# Patient Record
Sex: Male | Born: 1970 | Race: White | Hispanic: No | Marital: Single | Smoking: Current every day smoker
Health system: Southern US, Community
[De-identification: ages and names within clinical notes are randomized; demographics above are authoritative.]

## PROBLEM LIST (undated history)

## (undated) DIAGNOSIS — J449 Chronic obstructive pulmonary disease, unspecified: Secondary | ICD-10-CM

## (undated) DIAGNOSIS — K219 Gastro-esophageal reflux disease without esophagitis: Secondary | ICD-10-CM

---

## 2009-04-26 ENCOUNTER — Emergency Department: Payer: Self-pay | Admitting: Internal Medicine

## 2009-12-13 ENCOUNTER — Emergency Department: Payer: Self-pay | Admitting: Emergency Medicine

## 2010-05-23 ENCOUNTER — Ambulatory Visit: Payer: Self-pay | Admitting: Cardiovascular Disease

## 2010-05-23 ENCOUNTER — Inpatient Hospital Stay: Payer: Self-pay | Admitting: Internal Medicine

## 2011-03-21 ENCOUNTER — Emergency Department: Payer: Self-pay | Admitting: Unknown Physician Specialty

## 2013-06-13 ENCOUNTER — Ambulatory Visit: Payer: Self-pay | Admitting: Gastroenterology

## 2013-07-04 ENCOUNTER — Ambulatory Visit: Payer: Self-pay | Admitting: Family Medicine

## 2014-05-15 ENCOUNTER — Emergency Department: Payer: Self-pay | Admitting: Emergency Medicine

## 2016-11-20 ENCOUNTER — Emergency Department: Payer: Self-pay

## 2016-11-20 ENCOUNTER — Encounter: Payer: Self-pay | Admitting: *Deleted

## 2016-11-20 ENCOUNTER — Emergency Department
Admission: EM | Admit: 2016-11-20 | Discharge: 2016-11-20 | Disposition: A | Discharge status: Home / Self Care | Payer: Self-pay | Attending: Emergency Medicine | Admitting: Emergency Medicine

## 2016-11-20 DIAGNOSIS — F17200 Nicotine dependence, unspecified, uncomplicated: Secondary | ICD-10-CM | POA: Insufficient documentation

## 2016-11-20 DIAGNOSIS — J449 Chronic obstructive pulmonary disease, unspecified: Secondary | ICD-10-CM | POA: Insufficient documentation

## 2016-11-20 DIAGNOSIS — J40 Bronchitis, not specified as acute or chronic: Secondary | ICD-10-CM | POA: Insufficient documentation

## 2016-11-20 HISTORY — DX: Chronic obstructive pulmonary disease, unspecified: J44.9

## 2016-11-20 HISTORY — DX: Gastro-esophageal reflux disease without esophagitis: K21.9

## 2016-11-20 LAB — BASIC METABOLIC PANEL WITH GFR
Anion gap: 8 (ref 5–15)
BUN: 22 mg/dL — ABNORMAL HIGH (ref 6–20)
CO2: 24 mmol/L (ref 22–32)
Calcium: 9.3 mg/dL (ref 8.9–10.3)
Chloride: 104 mmol/L (ref 101–111)
Creatinine, Ser: 0.92 mg/dL (ref 0.61–1.24)
GFR calc Af Amer: >60 mL/min
GFR calc non Af Amer: >60 mL/min
Glucose, Bld: 99 mg/dL (ref 65–99)
Potassium: 3.7 mmol/L (ref 3.5–5.1)
Sodium: 136 mmol/L (ref 135–145)

## 2016-11-20 LAB — CBC
HCT: 39.3 % — ABNORMAL LOW (ref 40.0–52.0)
HEMOGLOBIN: 14 g/dL (ref 13.0–18.0)
MCH: 32.5 pg (ref 26.0–34.0)
MCHC: 35.6 g/dL (ref 32.0–36.0)
MCV: 91.3 fL (ref 80.0–100.0)
Platelets: 241 10*3/uL (ref 150–440)
RBC: 4.3 MIL/uL — ABNORMAL LOW (ref 4.40–5.90)
RDW: 14.9 % — ABNORMAL HIGH (ref 11.5–14.5)
WBC: 10.8 10*3/uL — ABNORMAL HIGH (ref 3.8–10.6)

## 2016-11-20 LAB — TROPONIN I: Troponin I: <0.03 ng/mL

## 2016-11-20 MED ORDER — PREDNISONE 20 MG PO TABS: 60.0000 mg | ORAL_TABLET | Freq: Every day | ORAL | 0 refills | Status: AC

## 2016-11-20 MED ORDER — ALBUTEROL SULFATE (2.5 MG/3ML) 0.083% IN NEBU
5.0000 mg | INHALATION_SOLUTION | Freq: Once | RESPIRATORY_TRACT | Status: AC
  Administered 2016-11-20: 5 mg via RESPIRATORY_TRACT
  Filled 2016-11-20: qty 6

## 2016-11-20 MED ORDER — ALBUTEROL SULFATE (2.5 MG/3ML) 0.083% IN NEBU
INHALATION_SOLUTION | RESPIRATORY_TRACT | Status: AC
  Administered 2016-11-20: 5 mg via RESPIRATORY_TRACT
  Filled 2016-11-20: qty 6

## 2016-11-20 MED ORDER — ALBUTEROL SULFATE HFA 108 (90 BASE) MCG/ACT IN AERS: 2.0000 | INHALATION_SPRAY | Freq: Four times a day (QID) | RESPIRATORY_TRACT | 2 refills | Status: AC | PRN

## 2016-11-20 MED ORDER — IOPAMIDOL (ISOVUE-370) INJECTION 76%
75.0000 mL | Freq: Once | INTRAVENOUS | Status: AC | PRN
  Administered 2016-11-20: 75 mL via INTRAVENOUS
  Filled 2016-11-20: qty 75

## 2016-11-20 MED ORDER — AZITHROMYCIN 250 MG PO TABS
ORAL_TABLET | ORAL | 0 refills | Status: AC
Start: 2016-11-20 — End: 2016-11-25

## 2016-11-20 NOTE — ED Provider Notes (Signed)
[facility] Emergency Department Provider Note   First MD Initiated Contact with Patient 11/20/16 1126     (approximate)  I have reviewed the triage vital signs and the nursing notes.   HISTORY  Chief Complaint Cough; Nasal Congestion; and Abdominal Pain    HPI Mason ShamesJames A Wagner is a 46 y.o. male with history COPD, one pack per day tobacco use presents to the emergency department with cough congestion and chest discomfort times "a few weeks". Patient also admits to night sweats however no fever.   Past Medical History:  Diagnosis Date  . COPD (chronic obstructive pulmonary disease) (HCC)   . GERD (gastroesophageal reflux disease)     There are no active problems to display for this patient.   History reviewed. No pertinent surgical history.  Prior to Admission medications   Medication Sig Start Date End Date Taking? Authorizing Provider  albuterol (PROVENTIL HFA;VENTOLIN HFA) 108 (90 Base) MCG/ACT inhaler Inhale 2 puffs into the lungs every 6 (six) hours as needed for wheezing or shortness of breath. 11/20/16   Darci Currentandolph N Brown, MD  azithromycin (ZITHROMAX Z-PAK) 250 MG tablet Take 2 tablets (500 mg) on  Day 1,  followed by 1 tablet (250 mg) once daily on Days 2 through 5. 11/20/16 11/25/16  Darci Currentandolph N Brown, MD  predniSONE (DELTASONE) 20 MG tablet Take 3 tablets (60 mg total) by mouth daily. 11/20/16 11/25/16  Darci Currentandolph N Brown, MD    Allergies Morphine and related  No family history on file.  Social History Social History  Substance Use Topics  . Smoking status: Current Every Day Smoker  . Smokeless tobacco: Current User  . Alcohol use No    Review of Systems Constitutional: No fever/chills Eyes: No visual changes. ENT: No sore throat. Cardiovascular: Positive for chest pain. Respiratory: Denies shortness of breath. Gastrointestinal: No abdominal pain.  No nausea, no vomiting.  No diarrhea.  No constipation. Genitourinary: Negative for  dysuria. Musculoskeletal: Negative for back pain. Skin: Negative for rash. Neurological: Negative for headaches, focal weakness or numbness.  10-point ROS otherwise negative.  ____________________________________________   PHYSICAL EXAM:  VITAL SIGNS: ED Triage Vitals  Enc Vitals Group     BP 11/20/16 0927 (!) 174/102     Pulse Rate 11/20/16 0927 84     Resp 11/20/16 0927 18     Temp 11/20/16 0927 98.1 F (36.7 C)     Temp Source 11/20/16 0927 Oral     SpO2 11/20/16 0927 97 %     Weight 11/20/16 0926 145 lb (65.8 kg)     Height 11/20/16 0926 5\' 11"  (1.803 m)     Head Circumference --      Peak Flow --      Pain Score 11/20/16 0924 5     Pain Loc --      Pain Edu? --      Excl. in GC? --     Constitutional: Alert and oriented. Well appearing and in no acute distress. Eyes: Conjunctivae are normal. PERRL. EOMI. Head: Atraumatic. Nose: No congestion/rhinnorhea. Mouth/Throat: Mucous membranes are moist.  Oropharynx non-erythematous. Neck: No stridor.  Cardiovascular: Normal rate, regular rhythm. Good peripheral circulation. Grossly normal heart sounds. Respiratory: Normal respiratory effort.  No retractions. Lungs CTAB. Gastrointestinal: Soft and nontender. No distention.  Musculoskeletal: No lower extremity tenderness nor edema. No gross deformities of extremities. Neurologic:  Normal speech and language. No gross focal neurologic deficits are appreciated.  Skin:  Skin is warm, dry and intact.  No rash noted.  ____________________________________________   LABS (all labs ordered are listed, but only abnormal results are displayed)  Labs Reviewed  BASIC METABOLIC PANEL - Abnormal; Notable for the following:       Result Value   BUN 22 (*)    All other components within normal limits  CBC - Abnormal; Notable for the following:    WBC 10.8 (*)    RBC 4.30 (*)    HCT 39.3 (*)    RDW 14.9 (*)    All other components within normal limits  TROPONIN I    ____________________________________________  EKG  ED ECG REPORT I, [place] N BROWN, the attending physician, personally viewed and interpreted this ECG.   Date: 11/20/2016  EKG Time: 9:55 AM  Rate: 68  Rhythm: Normal sinus rhythm  Axis: Normal  Intervals: Normal  ST&T Change: None  ____________________________________________  RADIOLOGY I, [place] N BROWN, personally viewed and evaluated these images (plain radiographs) as part of my medical decision making, as well as reviewing the written report by the radiologist.  Dg Chest 2 View  Result Date: 11/20/2016 CLINICAL DATA:  Pneumonia, cold symptoms, chest/abdominal pain EXAM: CHEST  2 VIEW COMPARISON:  05/23/2010 FINDINGS: Calcified granuloma in the lingula, chronic, benign. Lungs otherwise clear.  No pleural effusion or pneumothorax. Heart is normal in size. Mild degenerative changes of the lower thoracic spine. IMPRESSION: No evidence of acute cardiopulmonary disease. Electronically Signed   By: Charline Bills M.D.   On: 11/20/2016 12:02   Ct Angio Chest Pe W And/or Wo Contrast  Result Date: 11/20/2016 CLINICAL DATA:  Pt states he feels as if he has pneumonia, states he has been "fighting a cold" for several weeks, states generalized abd pain, states lower back pain that comes around to the front of his chest, denies any cough, hx of copd, smoker EXAM: CT ANGIOGRAPHY CHEST WITH CONTRAST TECHNIQUE: Multidetector CT imaging of the chest was performed using the standard protocol during bolus administration of intravenous contrast. Multiplanar CT image reconstructions and MIPs were obtained to evaluate the vascular anatomy. CONTRAST:  75 mL of Isovue 370 intravenous contrast COMPARISON:  Current chest radiograph FINDINGS: Cardiovascular: Satisfactory opacification of the pulmonary arteries to the segmental level. No evidence of pulmonary embolism. Normal heart size. No pericardial effusion. No coronary artery calcifications. The  great vessels normal in caliber. No aortic dissection or atherosclerosis. Mediastinum/Nodes: No neck base or axillary masses or adenopathy. Several calcified subcentimeter mediastinal lymph nodes. No mediastinal or hilar masses. No pathologically enlarged lymph nodes. Lungs/Pleura: Calcified granuloma left upper lobe lingula. Lungs otherwise clear. No pleural effusion. No pneumothorax. Upper Abdomen: No acute abnormality. Musculoskeletal: Unremarkable. Review of the MIP images confirms the above findings. IMPRESSION: 1. No acute findings.  No evidence of a pulmonary thromboembolism. Electronically Signed   By: Amie Portland M.D.   On: 11/20/2016 12:45      Procedures     INITIAL IMPRESSION / ASSESSMENT AND PLAN / ED COURSE  Pertinent labs & imaging results that were available during my care of the patient were reviewed by me and considered in my medical decision making (see chart for details).   Patient with history of COPD presents to the emergency department with dyspnea cough congestion and chest discomfort times several weeks. Laboratory data unremarkable EKG likewise CT scan of chest revealed no acute intra-thoracic abnormality.     ____________________________________________  FINAL CLINICAL IMPRESSION(S) / ED DIAGNOSES  Final diagnoses:  Bronchitis     MEDICATIONS GIVEN DURING THIS  VISIT:  Medications  iopamidol (ISOVUE-370) 76 % injection 75 mL (75 mLs Intravenous Contrast Given 11/20/16 1230)     NEW OUTPATIENT MEDICATIONS STARTED DURING THIS VISIT:  New Prescriptions   ALBUTEROL (PROVENTIL HFA;VENTOLIN HFA) 108 (90 BASE) MCG/ACT INHALER    Inhale 2 puffs into the lungs every 6 (six) hours as needed for wheezing or shortness of breath.   AZITHROMYCIN (ZITHROMAX Z-PAK) 250 MG TABLET    Take 2 tablets (500 mg) on  Day 1,  followed by 1 tablet (250 mg) once daily on Days 2 through 5.   PREDNISONE (DELTASONE) 20 MG TABLET    Take 3 tablets (60 mg total) by mouth daily.     Modified Medications   No medications on file    Discontinued Medications   No medications on file     Note:  This document was prepared using Dragon voice recognition software and may include unintentional dictation errors.    Darci Current, MD 11/20/16 1344

## 2016-11-20 NOTE — ED Triage Notes (Addendum)
Pt states he feels as if he has pneumonia, states he has been "fighting a cold" for several weeks, states generalized abd pain, states lower back pain that comes around to the front of his chest, denies any cough, awake and alert in no acute distress

## 2016-11-28 ENCOUNTER — Emergency Department: Payer: Self-pay

## 2016-11-28 ENCOUNTER — Emergency Department
Admission: EM | Admit: 2016-11-28 | Discharge: 2016-11-28 | Disposition: A | Discharge status: Home / Self Care | Payer: Self-pay | Attending: Emergency Medicine | Admitting: Emergency Medicine

## 2016-11-28 DIAGNOSIS — R1900 Intra-abdominal and pelvic swelling, mass and lump, unspecified site: Secondary | ICD-10-CM

## 2016-11-28 DIAGNOSIS — R55 Syncope and collapse: Secondary | ICD-10-CM | POA: Insufficient documentation

## 2016-11-28 DIAGNOSIS — R5383 Other fatigue: Secondary | ICD-10-CM | POA: Insufficient documentation

## 2016-11-28 DIAGNOSIS — R1013 Epigastric pain: Secondary | ICD-10-CM | POA: Insufficient documentation

## 2016-11-28 DIAGNOSIS — J449 Chronic obstructive pulmonary disease, unspecified: Secondary | ICD-10-CM | POA: Insufficient documentation

## 2016-11-28 DIAGNOSIS — F17200 Nicotine dependence, unspecified, uncomplicated: Secondary | ICD-10-CM | POA: Insufficient documentation

## 2016-11-28 DIAGNOSIS — R52 Pain, unspecified: Secondary | ICD-10-CM

## 2016-11-28 DIAGNOSIS — R42 Dizziness and giddiness: Secondary | ICD-10-CM | POA: Insufficient documentation

## 2016-11-28 LAB — COMPREHENSIVE METABOLIC PANEL
ALBUMIN: 4.2 g/dL (ref 3.5–5.0)
ALT: 21 U/L (ref 17–63)
ANION GAP: 9 (ref 5–15)
AST: 21 U/L (ref 15–41)
Alkaline Phosphatase: 53 U/L (ref 38–126)
BUN: 13 mg/dL (ref 6–20)
CHLORIDE: 104 mmol/L (ref 101–111)
CO2: 24 mmol/L (ref 22–32)
Calcium: 8.4 mg/dL — ABNORMAL LOW (ref 8.9–10.3)
Creatinine, Ser: 0.77 mg/dL (ref 0.61–1.24)
GFR calc Af Amer: >60 mL/min (ref >60)
GFR calc non Af Amer: >60 mL/min (ref >60)
GLUCOSE: 108 mg/dL — AB (ref 65–99)
POTASSIUM: 3.3 mmol/L — AB (ref 3.5–5.1)
SODIUM: 137 mmol/L (ref 135–145)
Total Bilirubin: 0.6 mg/dL (ref 0.3–1.2)
Total Protein: 6.9 g/dL (ref 6.5–8.1)

## 2016-11-28 LAB — CBC
HEMATOCRIT: 38.1 % — AB (ref 40.0–52.0)
HEMOGLOBIN: 13.3 g/dL (ref 13.0–18.0)
MCH: 32 pg (ref 26.0–34.0)
MCHC: 34.9 g/dL (ref 32.0–36.0)
MCV: 91.6 fL (ref 80.0–100.0)
Platelets: 263 10*3/uL (ref 150–440)
RBC: 4.16 MIL/uL — ABNORMAL LOW (ref 4.40–5.90)
RDW: 14.6 % — AB (ref 11.5–14.5)
WBC: 11.3 10*3/uL — AB (ref 3.8–10.6)

## 2016-11-28 LAB — URINALYSIS, COMPLETE (UACMP) WITH MICROSCOPIC
BACTERIA UA: NONE SEEN
Bilirubin Urine: NEGATIVE
Glucose, UA: NEGATIVE mg/dL
Hgb urine dipstick: NEGATIVE
Ketones, ur: NEGATIVE mg/dL
LEUKOCYTES UA: NEGATIVE
Nitrite: NEGATIVE
PH: 7 (ref 5.0–8.0)
Protein, ur: NEGATIVE mg/dL
SQUAMOUS EPITHELIAL / LPF: NONE SEEN
Specific Gravity, Urine: 1.009 (ref 1.005–1.030)
WBC UA: NONE SEEN WBC/hpf (ref 0–5)

## 2016-11-28 LAB — LIPASE, BLOOD: LIPASE: 39 U/L (ref 11–51)

## 2016-11-28 LAB — TROPONIN I

## 2016-11-28 MED ORDER — OMEPRAZOLE 40 MG PO CPDR: 40.0000 mg | DELAYED_RELEASE_CAPSULE | Freq: Every day | ORAL | 0 refills | Status: DC

## 2016-11-28 MED ORDER — POTASSIUM CHLORIDE CRYS ER 20 MEQ PO TBCR
40.0000 meq | EXTENDED_RELEASE_TABLET | Freq: Once | ORAL | Status: AC
  Administered 2016-11-28: 40 meq via ORAL
  Filled 2016-11-28: qty 2

## 2016-11-28 MED ORDER — GI COCKTAIL ~~LOC~~
30.0000 mL | Freq: Once | ORAL | Status: AC
  Administered 2016-11-28: 30 mL via ORAL
  Filled 2016-11-28: qty 30

## 2016-11-28 NOTE — ED Provider Notes (Addendum)
[facility] Emergency Department Provider Note  ____________________________________________  Time seen: Approximately 2:56 PM  I have reviewed the triage vital signs and the nursing notes.   HISTORY  Chief Complaint Abdominal Pain and Fatigue    HPI Mason Wagner is a 46 y.o. male with a history of GERD, he self discontinued his omeprazole, COPD, presenting with several years of episodes of upper abdominal swelling, associated with discomfort, burning sensation and pain. The patient states that he is a chronic drinker, and notices that after drinking hard liquor but not beer, he will often develop swelling in the upper abdomen associated with a burning sensation that goes to the central part of his chest, associated with burping. He does not have palpitations, shortness of breath, but last night at work, the pain was so severe that he felt like he might pass out. No recent episodes of forceful vomiting, diarrhea, fever or chills.  The patient has a barium swallow examination from 2014 which shows a small reducible hiatal hernia. He reports having had endoscopy, but I do not see this in our system.   Past Medical History:  Diagnosis Date  . COPD (chronic obstructive pulmonary disease) (HCC)   . GERD (gastroesophageal reflux disease)     There are no active problems to display for this patient.   History reviewed. No pertinent surgical history.  Current Outpatient Rx  . Order #: [id number] Class: Print  . Order #: [id number] Class: Print    Allergies Morphine and related and Biaxin [clarithromycin]  No family history on file.  Social History Social History  Substance Use Topics  . Smoking status: Current Every Day Smoker  . Smokeless tobacco: Current User  . Alcohol use Yes    Review of Systems Constitutional: No fever/chills.Positive lightheadedness. No syncope. Eyes: No visual changes. ENT: No sore throat. No congestion or  rhinorrhea. Cardiovascular: Positive chest pain. Denies palpitations. Respiratory: Denies shortness of breath.  No cough. Gastrointestinal: Positive epigastric and upper abdominal pain.  No nausea, no vomiting.  No diarrhea.  No constipation. Genitourinary: Negative for dysuria. Musculoskeletal: Negative for back pain. Skin: Negative for rash. Neurological: Negative for headaches. No focal numbness, tingling or weakness.  Psychiatric:Positive chronic alcohol abuse.  10-point ROS otherwise negative.  ____________________________________________   PHYSICAL EXAM:  VITAL SIGNS: ED Triage Vitals  Enc Vitals Group     BP 11/28/16 1135 (!) 159/104     Pulse Rate 11/28/16 1135 88     Resp 11/28/16 1135 18     Temp 11/28/16 1135 98 F (36.7 C)     Temp Source 11/28/16 1135 Oral     SpO2 11/28/16 1135 98 %     Weight 11/28/16 1136 145 lb (65.8 kg)     Height 11/28/16 1136 5\' 11"  (1.803 m)     Head Circumference --      Peak Flow --      Pain Score 11/28/16 1136 5     Pain Loc --      Pain Edu? --      Excl. in GC? --     Constitutional: Alert and oriented. Chronically ill appearing and in no acute distress. Answers questions appropriately. Eyes: Conjunctivae are normal.  EOMI. No scleral icterus. Head: Atraumatic. Nose: No congestion/rhinnorhea. Mouth/Throat: Mucous membranes are mildly dry.  Neck: No stridor.  Supple.  No JVD. No meningismus. Cardiovascular: Normal rate, regular rhythm. No murmurs, rubs or gallops.  Respiratory: Normal respiratory effort.  No accessory muscle use or retractions. Lungs  CTAB.  No wheezes, rales or ronchi. Prolonged expiratory phase. Gastrointestinal: Soft, and nondistended.  Minimal tenderness to palpation in the epigastrium. No guarding or rebound.  No peritoneal signs. Musculoskeletal: No LE edema.  Neurologic:  A&Ox3.  Speech is clear.  Face and smile are symmetric.  EOMI. PERRLA. Moves all extremities well. Normal gait without ataxia. Skin:   Skin is warm, dry and intact. No rash noted. Psychiatric: Mood and affect are normal. Speech and behavior are normal.  Normal judgement.  ____________________________________________   LABS (all labs ordered are listed, but only abnormal results are displayed)  Labs Reviewed  COMPREHENSIVE METABOLIC PANEL - Abnormal; Notable for the following:       Result Value   Potassium 3.3 (*)    Glucose, Bld 108 (*)    Calcium 8.4 (*)    All other components within normal limits  CBC - Abnormal; Notable for the following:    WBC 11.3 (*)    RBC 4.16 (*)    HCT 38.1 (*)    RDW 14.6 (*)    All other components within normal limits  URINALYSIS, COMPLETE (UACMP) WITH MICROSCOPIC - Abnormal; Notable for the following:    Color, Urine STRAW (*)    APPearance CLEAR (*)    All other components within normal limits  LIPASE, BLOOD  TROPONIN I   ____________________________________________  EKG  ED ECG REPORT I, Rockne Menghini, the attending physician, personally viewed and interpreted this ECG.   Date: 11/28/2016  EKG Time: 1543  Rate: 71  Rhythm: normal sinus rhythm  Axis: normal  Intervals:none  ST&T Change: Nonspecific T-wave inversion in V1. 0.5 mm ST elevation in V3 and V2 which are consistent with early repolarization.   ____________________________________________  RADIOLOGY  Dg Chest 2 View  Result Date: 11/28/2016 CLINICAL DATA:  Fatigue and chest pain EXAM: CHEST  2 VIEW COMPARISON:  11/20/2016 FINDINGS: Cardiac shadow is within normal limits. Calcified granuloma is again seen within the lingula. Nipple shadows are noted. No focal infiltrate or sizable effusion is seen. No bony abnormality is noted. IMPRESSION: No active cardiopulmonary disease. Electronically Signed   By: Alcide Clever M.D.   On: 11/28/2016 15:31   Dg Abd 1 View  Result Date: 11/28/2016 CLINICAL DATA:  Pt reports fatigue, abdominal swelling intermittently for a couple of years. Pt reporting swelling  to left quadrant, reports heavy intake of liquor yesterday. Recent hx of bronchitis. Pt has COPD, GERD.Smoker EXAM: ABDOMEN - 1 VIEW COMPARISON:  None. FINDINGS: Normal bowel gas pattern. No evidence of renal or ureteral stones. Soft tissues are unremarkable. Skeletal structures are unremarkable. IMPRESSION: Negative. Electronically Signed   By: Amie Portland M.D.   On: 11/28/2016 15:28    ____________________________________________   PROCEDURES  Procedure(s) performed: None  Procedures  Critical Care performed: No ____________________________________________   INITIAL IMPRESSION / ASSESSMENT AND PLAN / ED COURSE  Pertinent labs & imaging results that were available during my care of the patient were reviewed by me and considered in my medical decision making (see chart for details).  46 y.o. male with chronic alcohol abuse, history of GERD off his medication, COPD with ongoing tobacco abuse presenting with frequent episodes of epigastric discomfort, chest burning and burping, worse after alcohol intake. It is likely that the patient has GERD symptoms, or possibly peptic or gastric ulcers. On my examination today, there is no evidence of perforation but will get a chest x-ray for confirmation. A cardiac cause for his symptoms is possible, also get  a screening EKG, troponin, and also evaluate his cardiac border on the chest x-ray. I will treat the patient with a GI cocktail, and if his workup in the emergency department is reassuring, plan to discharge him home with a new prescription for daily omeprazole, and follow-up with his primary care physician, as well as GI follow-up.  ----------------------------------------- 3:54 PM on 11/28/2016 -----------------------------------------  The patient has a reassuring workup in the emergency department. His chest x-ray does not show any acute cardiopulmonary process. His EKG does not show ischemic changes and his troponin is negative. He has no  abdominal x-ray which does not show any free air or other complications. I will plan to reinitiate the patient's treatment for GERD, and have him follow up bland food diet. He will follow-up with his primary care physician as well as our gastroenterologist on-call. He understands return precautions as well as follow-up instructions.  ____________________________________________  FINAL CLINICAL IMPRESSION(S) / ED DIAGNOSES  Final diagnoses:  Epigastric pain  Abdominal swelling  Pre-syncope         NEW MEDICATIONS STARTED DURING THIS VISIT:  New Prescriptions   OMEPRAZOLE (PRILOSEC) 40 MG CAPSULE    Take 1 capsule (40 mg total) by mouth daily.      Rockne MenghiniAnne-Caroline Leonidas Boateng, MD 11/28/16 1555    Rockne MenghiniAnne-Caroline Laquiesha Piacente, MD 11/28/16 1556

## 2016-11-28 NOTE — ED Triage Notes (Signed)
Pt reports fatigue, abdominal swelling intermittently for a couple of years. Pt reporting swelling to left quadrant, reports heavy intake of liquor yesterday. Pt alert and oriented X4, active, cooperative, pt in NAD. RR even and unlabored, color WNL.

## 2016-11-28 NOTE — Discharge Instructions (Signed)
Please restart the omeprazole medication and follow the food choices recommendations for GERD. Please make a follow-up appointment with Dr. Tobi BastosAnna, the gastroenterologist for further evaluation. You will also need follow-up with your primary care physician for your symptoms.  Return to the emergency department if you develop severe pain, inability to keep down fluids, fever, or any other symptoms concerning to you.

## 2019-01-21 ENCOUNTER — Emergency Department: Payer: Commercial Managed Care - PPO

## 2019-01-21 ENCOUNTER — Other Ambulatory Visit: Payer: Self-pay

## 2019-01-21 ENCOUNTER — Emergency Department
Admission: EM | Admit: 2019-01-21 | Discharge: 2019-01-21 | Disposition: A | Discharge status: Home / Self Care | Payer: Commercial Managed Care - PPO | Attending: Emergency Medicine | Admitting: Emergency Medicine

## 2019-01-21 ENCOUNTER — Encounter: Payer: Self-pay | Admitting: Emergency Medicine

## 2019-01-21 DIAGNOSIS — J441 Chronic obstructive pulmonary disease with (acute) exacerbation: Secondary | ICD-10-CM | POA: Insufficient documentation

## 2019-01-21 DIAGNOSIS — F17210 Nicotine dependence, cigarettes, uncomplicated: Secondary | ICD-10-CM | POA: Diagnosis not present

## 2019-01-21 DIAGNOSIS — R0600 Dyspnea, unspecified: Secondary | ICD-10-CM | POA: Diagnosis present

## 2019-01-21 LAB — CBC
HCT: 41.9 % (ref 39.0–52.0)
Hemoglobin: 14.7 g/dL (ref 13.0–17.0)
MCH: 32.5 pg (ref 26.0–34.0)
MCHC: 35.1 g/dL (ref 30.0–36.0)
MCV: 92.7 fL (ref 80.0–100.0)
NRBC: 0 % (ref 0.0–0.2)
Platelets: 242 10*3/uL (ref 150–400)
RBC: 4.52 MIL/uL (ref 4.22–5.81)
RDW: 13.2 % (ref 11.5–15.5)
WBC: 7.8 10*3/uL (ref 4.0–10.5)

## 2019-01-21 LAB — TROPONIN I: Troponin I: <0.03 ng/mL (ref <0.03)

## 2019-01-21 LAB — COMPREHENSIVE METABOLIC PANEL
ALBUMIN: 4.7 g/dL (ref 3.5–5.0)
ALT: 81 U/L — ABNORMAL HIGH (ref 0–44)
AST: 79 U/L — ABNORMAL HIGH (ref 15–41)
Alkaline Phosphatase: 59 U/L (ref 38–126)
Anion gap: 14 (ref 5–15)
BUN: 10 mg/dL (ref 6–20)
CO2: 20 mmol/L — ABNORMAL LOW (ref 22–32)
CREATININE: 0.75 mg/dL (ref 0.61–1.24)
Calcium: 9.1 mg/dL (ref 8.9–10.3)
Chloride: 101 mmol/L (ref 98–111)
GFR calc Af Amer: >60 mL/min (ref >60)
GFR calc non Af Amer: >60 mL/min (ref >60)
GLUCOSE: 88 mg/dL (ref 70–99)
Potassium: 3.9 mmol/L (ref 3.5–5.1)
Sodium: 135 mmol/L (ref 135–145)
Total Bilirubin: 0.9 mg/dL (ref 0.3–1.2)
Total Protein: 8.2 g/dL — ABNORMAL HIGH (ref 6.5–8.1)

## 2019-01-21 MED ORDER — ALBUTEROL SULFATE (2.5 MG/3ML) 0.083% IN NEBU
3.0000 mL | INHALATION_SOLUTION | RESPIRATORY_TRACT | Status: DC | PRN
  Administered 2019-01-21: 3 mL via RESPIRATORY_TRACT
  Filled 2019-01-21: qty 3

## 2019-01-21 MED ORDER — PREDNISONE 50 MG PO TABS: 50.0000 mg | ORAL_TABLET | Freq: Every day | ORAL | 0 refills | Status: AC

## 2019-01-21 MED ORDER — SODIUM CHLORIDE 0.9 % IV SOLN
1000.0000 mL | Freq: Once | INTRAVENOUS | Status: AC
  Administered 2019-01-21: 1000 mL via INTRAVENOUS

## 2019-01-21 MED ORDER — LORAZEPAM 1 MG PO TABS
1.0000 mg | ORAL_TABLET | Freq: Once | ORAL | Status: AC
  Administered 2019-01-21: 1 mg via ORAL
  Filled 2019-01-21: qty 1

## 2019-01-21 NOTE — ED Provider Notes (Signed)
[facility] Emergency Department Provider Note   ____________________________________________    I have reviewed the triage vital signs and the nursing notes.   HISTORY  Chief Complaint Chest Pain and Shortness of Breath     HPI Mason Wagner is a 48 y.o. male with a history of COPD who presents with complaints of difficulty breathing.  Patient is quite anxious and feels that he cannot breathe although respiratory rate is normal and oxygen saturations are normal.  He does admit to smoking methamphetamine this morning.  He does smoke cigarettes as well.  Denies chest pain or pleurisy.  No calf pain or swelling.  No recent travel.  No fevers chills or cough.  Past Medical History:  Diagnosis Date  . COPD (chronic obstructive pulmonary disease) (HCC)   . GERD (gastroesophageal reflux disease)     There are no active problems to display for this patient.   History reviewed. No pertinent surgical history.  Prior to Admission medications   Medication Sig Start Date End Date Taking? Authorizing Provider  albuterol (PROVENTIL HFA;VENTOLIN HFA) 108 (90 Base) MCG/ACT inhaler Inhale 2 puffs into the lungs every 6 (six) hours as needed for wheezing or shortness of breath. 11/20/16   Darci Current, MD  omeprazole (PRILOSEC) 40 MG capsule Take 1 capsule (40 mg total) by mouth daily. 11/28/16 11/28/17  Rockne Menghini, MD  predniSONE (DELTASONE) 50 MG tablet Take 1 tablet (50 mg total) by mouth daily with breakfast. 01/21/19   Jene Every, MD     Allergies Morphine and related and Biaxin [clarithromycin]  History reviewed. No pertinent family history.  Social History Social History   Tobacco Use  . Smoking status: Current Every Day Smoker    Packs/day: 2.00  . Smokeless tobacco: Current User  Substance Use Topics  . Alcohol use: Yes    Alcohol/week: 84.0 standard drinks    Types: 84 Cans of beer per week  . Drug use: Yes    Types:  Methamphetamines    Review of Systems  Constitutional: No fever/chills Eyes: No visual changes.  ENT: No sore throat. Cardiovascular: Denies chest pain. Respiratory: As above Gastrointestinal: No abdominal pain.  No nausea, no vomiting.   Genitourinary: Negative for dysuria. Musculoskeletal: Negative for back pain. Skin: Negative for rash. Neurological: Negative for headaches or weakness   ____________________________________________   PHYSICAL EXAM:  VITAL SIGNS: ED Triage Vitals  Enc Vitals Group     BP 01/21/19 1208 (!) 148/85     Pulse Rate 01/21/19 1208 100     Resp 01/21/19 1208 13     Temp 01/21/19 1208 98.4 F (36.9 C)     Temp Source 01/21/19 1208 Oral     SpO2 01/21/19 1208 100 %     Weight 01/21/19 1210 68 kg (150 lb)     Height 01/21/19 1210 1.829 m (6')     Head Circumference --      Peak Flow --      Pain Score 01/21/19 1210 5     Pain Loc --      Pain Edu? --      Excl. in GC? --     Constitutional: Alert and oriented. Eyes: Conjunctivae are normal.   Nose: No congestion/rhinnorhea. Mouth/Throat: Mucous membranes are moist.   Neck:  Painless ROM Cardiovascular: Normal rate, regular rhythm. Grossly normal heart sounds.  Good peripheral circulation. Respiratory: Normal respiratory effort.  No retractions.  Scattered mild wheezes, good airflow Gastrointestinal: Soft and  nontender. No distention.   Musculoskeletal: No lower extremity tenderness nor edema.  Warm and well perfused Neurologic:  Normal speech and language. No gross focal neurologic deficits are appreciated.  Skin:  Skin is warm, dry and intact. No rash noted. Psychiatric: Mood and affect are normal. Speech and behavior are normal.  ____________________________________________   LABS (all labs ordered are listed, but only abnormal results are displayed)  Labs Reviewed  COMPREHENSIVE METABOLIC PANEL - Abnormal; Notable for the following components:      Result Value   CO2 20 (*)     Total Protein 8.2 (*)    AST 79 (*)    ALT 81 (*)    All other components within normal limits  CBC  TROPONIN I   ____________________________________________  EKG  ED ECG REPORT I, Jene Every, the attending physician, personally viewed and interpreted this ECG.  Date: 01/21/2019  Rhythm: Sinus tachycardia QRS Axis: normal Intervals: normal ST/T Wave abnormalities: normal Narrative Interpretation: no evidence of acute ischemia  ____________________________________________  RADIOLOGY  X-ray unremarkable ____________________________________________   PROCEDURES  Procedure(s) performed: No  Procedures   Critical Care performed: No ____________________________________________   INITIAL IMPRESSION / ASSESSMENT AND PLAN / ED COURSE  Pertinent labs & imaging results that were available during my care of the patient were reviewed by me and considered in my medical decision making (see chart for details).  Patient overall well-appearing and in no acute distress, vital signs reassuring, no evidence of respiratory distress.  Suspect methamphetamines are playing a role in his presentation today.  We will check labs, chest x-ray, placed on cardiac monitor, and reevaluate.  Lab work is unremarkable, chest x-ray is benign, no pneumothorax or pneumonia.  Patient given 1 mg p.o. Ativan to help him relax as he remains highly anxious  ----------------------------------------- 3:11 PM on 01/21/2019 -----------------------------------------  Patient remains well-appearing, vitals normal, observed in the ED for 3 hours, no hypoxia or tachycardia or tachypnea.  Appropriate for discharge at this time, patient agrees with plan, return precautions discussed    ____________________________________________   FINAL CLINICAL IMPRESSION(S) / ED DIAGNOSES  Final diagnoses:  COPD exacerbation (HCC)        Note:  This document was prepared using Dragon voice recognition  software and may include unintentional dictation errors.   Jene Every, MD 01/21/19 [phone]

## 2019-01-21 NOTE — ED Triage Notes (Signed)
Patient arrives from home via EMS with chest pain/epigastric pain with SOB. Used meth ("ice") @ 7am for the first time in years. Symptoms around 8am. Patient has hx panic attacks, anxiety, COPD. Used 1 line of meth, drinks daily, drank 6-8 beers this AM.

## 2019-01-23 ENCOUNTER — Emergency Department
Admission: EM | Admit: 2019-01-23 | Discharge: 2019-01-23 | Disposition: A | Discharge status: Home / Self Care | Payer: Commercial Managed Care - PPO | Attending: Emergency Medicine | Admitting: Emergency Medicine

## 2019-01-23 ENCOUNTER — Encounter: Payer: Self-pay | Admitting: *Deleted

## 2019-01-23 ENCOUNTER — Other Ambulatory Visit: Payer: Self-pay

## 2019-01-23 DIAGNOSIS — J449 Chronic obstructive pulmonary disease, unspecified: Secondary | ICD-10-CM | POA: Insufficient documentation

## 2019-01-23 DIAGNOSIS — R0602 Shortness of breath: Secondary | ICD-10-CM

## 2019-01-23 DIAGNOSIS — F17210 Nicotine dependence, cigarettes, uncomplicated: Secondary | ICD-10-CM | POA: Insufficient documentation

## 2019-01-23 LAB — CBC WITH DIFFERENTIAL/PLATELET
ABS IMMATURE GRANULOCYTES: 0.02 10*3/uL (ref 0.00–0.07)
Basophils Absolute: 0 10*3/uL (ref 0.0–0.1)
Basophils Relative: 0 %
Eosinophils Absolute: 0 10*3/uL (ref 0.0–0.5)
Eosinophils Relative: 0 %
HCT: 39.8 % (ref 39.0–52.0)
Hemoglobin: 13.7 g/dL (ref 13.0–17.0)
IMMATURE GRANULOCYTES: 0 %
Lymphocytes Relative: 11 %
Lymphs Abs: 0.9 10*3/uL (ref 0.7–4.0)
MCH: 32.6 pg (ref 26.0–34.0)
MCHC: 34.4 g/dL (ref 30.0–36.0)
MCV: 94.8 fL (ref 80.0–100.0)
Monocytes Absolute: 0.4 10*3/uL (ref 0.1–1.0)
Monocytes Relative: 4 %
NEUTROS ABS: 7 10*3/uL (ref 1.7–7.7)
Neutrophils Relative %: 85 %
Platelets: 233 10*3/uL (ref 150–400)
RBC: 4.2 MIL/uL — ABNORMAL LOW (ref 4.22–5.81)
RDW: 13.2 % (ref 11.5–15.5)
SMEAR REVIEW: NORMAL
WBC: 8.2 10*3/uL (ref 4.0–10.5)
nRBC: 0 % (ref 0.0–0.2)

## 2019-01-23 LAB — BASIC METABOLIC PANEL
Anion gap: 9 (ref 5–15)
BUN: 6 mg/dL (ref 6–20)
CO2: 23 mmol/L (ref 22–32)
Calcium: 9.5 mg/dL (ref 8.9–10.3)
Chloride: 103 mmol/L (ref 98–111)
Creatinine, Ser: 0.74 mg/dL (ref 0.61–1.24)
GFR calc Af Amer: >60 mL/min (ref >60)
GFR calc non Af Amer: >60 mL/min (ref >60)
Glucose, Bld: 132 mg/dL — ABNORMAL HIGH (ref 70–99)
Potassium: 4.3 mmol/L (ref 3.5–5.1)
Sodium: 135 mmol/L (ref 135–145)

## 2019-01-23 LAB — TROPONIN I

## 2019-01-23 LAB — LIPASE, BLOOD: Lipase: 33 U/L (ref 11–51)

## 2019-01-23 LAB — HEPATIC FUNCTION PANEL
ALT: 77 U/L — ABNORMAL HIGH (ref 0–44)
AST: 72 U/L — ABNORMAL HIGH (ref 15–41)
Albumin: 4.4 g/dL (ref 3.5–5.0)
Alkaline Phosphatase: 54 U/L (ref 38–126)
Bilirubin, Direct: 0.2 mg/dL (ref 0.0–0.2)
Indirect Bilirubin: 0.8 mg/dL (ref 0.3–0.9)
Total Bilirubin: 1 mg/dL (ref 0.3–1.2)
Total Protein: 7.7 g/dL (ref 6.5–8.1)

## 2019-01-23 MED ORDER — TIOTROPIUM BROMIDE MONOHYDRATE 18 MCG IN CAPS: 18.0000 ug | ORAL_CAPSULE | Freq: Every day | RESPIRATORY_TRACT | 2 refills | Status: AC

## 2019-01-23 MED ORDER — OMEPRAZOLE 40 MG PO CPDR: 40.0000 mg | DELAYED_RELEASE_CAPSULE | Freq: Every day | ORAL | 2 refills | Status: AC

## 2019-01-23 NOTE — Discharge Instructions (Addendum)
Take the Spiriva daily as prescribed.  You should also take the omeprazole to help decrease stomach acid and prevent the pain you are having.  We have prescribed a 62-month supply of both.  Follow-up with your primary care doctor.  Return to the ER for new or worsening shortness of breath, abdominal or chest pain, fever, or any other new or worsening symptoms that concern you.

## 2019-01-23 NOTE — ED Triage Notes (Addendum)
Pt to triage with shortness of breath and discomfort in the chest area.  Nausea.  cig smoker.  Dry cough   Pt also reports abd pain.  etoh use everyday reported by pt.   Pt alert.   Pt seen in ER 2 days ago with siimilar sx.

## 2019-01-23 NOTE — ED Provider Notes (Signed)
[facility] Emergency Department Provider Note ____________________________________________   First MD Initiated Contact with Patient 01/23/19 1521     (approximate)  I have reviewed the triage vital signs and the nursing notes.   HISTORY  Chief Complaint Shortness of Breath    HPI Mason Wagner is a 48 y.o. male with PMH as noted below who presents with shortness of breath, described as waking up and feeling like he could not breathe, and now resolved.  The patient states that he went to sleep on his couch and repeatedly awoke and then felt like he needed to take a deep breath.  He states he almost felt like if he fell asleep he would stop breathing.  He has no further shortness of breath now.  He has had slightly increased shortness of breath over the last several days.  In addition the patient reports some pain last night in his epigastric area radiating to his chest and back.  He has had this several times in the past as well.  The patient reports daily heavy alcohol use, on average 12 beers daily.  He also reports occasional methamphetamine use, most recently 2 days ago.  The patient was seen in the ED for shortness of breath 2 days ago with negative work-up at that time.  He states he used to take Spiriva but has not had it in a long time.  He has no fever, cough, myalgias, or any sick contacts.  Past Medical History:  Diagnosis Date  . COPD (chronic obstructive pulmonary disease) (HCC)   . GERD (gastroesophageal reflux disease)     There are no active problems to display for this patient.   No past surgical history on file.  Prior to Admission medications   Medication Sig Start Date End Date Taking? Authorizing Provider  albuterol (PROVENTIL HFA;VENTOLIN HFA) 108 (90 Base) MCG/ACT inhaler Inhale 2 puffs into the lungs every 6 (six) hours as needed for wheezing or shortness of breath. 11/20/16   Darci Current, MD  omeprazole (PRILOSEC) 40  MG capsule Take 1 capsule (40 mg total) by mouth daily. 01/23/19   Dionne Bucy, MD  predniSONE (DELTASONE) 50 MG tablet Take 1 tablet (50 mg total) by mouth daily with breakfast. 01/21/19   Jene Every, MD  tiotropium (SPIRIVA HANDIHALER) 18 MCG inhalation capsule Place 1 capsule (18 mcg total) into inhaler and inhale daily. 01/23/19   Dionne Bucy, MD    Allergies Morphine and related and Biaxin [clarithromycin]  No family history on file.  Social History Social History   Tobacco Use  . Smoking status: Current Every Day Smoker    Packs/day: 2.00  . Smokeless tobacco: Current User  Substance Use Topics  . Alcohol use: Yes    Alcohol/week: 84.0 standard drinks    Types: 84 Cans of beer per week  . Drug use: Yes    Types: Methamphetamines    Review of Systems  Constitutional: No fever/chills. Eyes: No redness. ENT: No sore throat. Cardiovascular: Denies chest pain. Respiratory: Positive for resolved shortness of breath. Gastrointestinal: No vomiting or diarrhea.  Genitourinary: Negative for flank pain.  Musculoskeletal: Negative for back pain. Skin: Negative for rash. Neurological: Negative for headache.   ____________________________________________   PHYSICAL EXAM:  VITAL SIGNS: ED Triage Vitals  Enc Vitals Group     BP 01/23/19 1511 (!) 157/103     Pulse Rate 01/23/19 1511 90     Resp 01/23/19 1511 20     Temp 01/23/19  1511 98.6 F (37 C)     Temp Source 01/23/19 1511 Oral     SpO2 01/23/19 1511 100 %     Weight 01/23/19 1512 150 lb (68 kg)     Height 01/23/19 1512 6' (1.829 m)     Head Circumference --      Peak Flow --      Pain Score 01/23/19 1511 0     Pain Loc --      Pain Edu? --      Excl. in GC? --     Constitutional: Alert and oriented.  Somewhat anxious appearing but in no acute distress. Eyes: Conjunctivae are normal.  Head: Atraumatic. Nose: No congestion/rhinnorhea. Mouth/Throat: Mucous membranes are slightly dry.    Neck: Normal range of motion.  Cardiovascular: Normal rate, regular rhythm. Good peripheral circulation. Respiratory: Normal respiratory effort.  No retractions. Gastrointestinal: Soft and nontender. No distention.  Genitourinary: No flank tenderness. Musculoskeletal:  Extremities warm and well perfused.  Neurologic:  Normal speech and language. No gross focal neurologic deficits are appreciated.  Skin:  Skin is warm and dry. No rash noted. Psychiatric: Mood and affect are normal. Speech and behavior are normal.  ____________________________________________   LABS (all labs ordered are listed, but only abnormal results are displayed)  Labs Reviewed  BASIC METABOLIC PANEL - Abnormal; Notable for the following components:      Result Value   Glucose, Bld 132 (*)    All other components within normal limits  CBC WITH DIFFERENTIAL/PLATELET - Abnormal; Notable for the following components:   RBC 4.20 (*)    All other components within normal limits  HEPATIC FUNCTION PANEL - Abnormal; Notable for the following components:   AST 72 (*)    ALT 77 (*)    All other components within normal limits  TROPONIN I  LIPASE, BLOOD   ____________________________________________  EKG  ED ECG REPORT I, Dionne Bucy, the attending physician, personally viewed and interpreted this ECG.  Date: 01/23/2019 EKG Time: 1514 Rate: 85 Rhythm: normal sinus rhythm QRS Axis: normal Intervals: normal ST/T Wave abnormalities: normal Narrative Interpretation: no evidence of acute ischemia  ____________________________________________  RADIOLOGY    ____________________________________________   PROCEDURES  Procedure(s) performed: No  Procedures  Critical Care performed: No ____________________________________________   INITIAL IMPRESSION / ASSESSMENT AND PLAN / ED COURSE  Pertinent labs & imaging results that were available during my care of the patient were reviewed by me and  considered in my medical decision making (see chart for details).  48 year old male with PMH of COPD as well as relatively heavy alcohol use and methamphetamine use presents with apparent shortness of breath this afternoon when he was trying to sleep, as well as some epigastric pain last night radiating to his back.  He denies active shortness of breath or chest pain at this time.  I reviewed the past medical records in Epic.  The patient was seen in the ED 2 days ago with similar symptoms and endorsed methamphetamine use at that time.  ED work-up including chest x-ray and labs were negative.  On exam today, the patient is anxious but relatively well appearing, and hypertensive with otherwise normal vital signs.  He has no hypoxia and no increased work of breathing or respiratory distress.  The remainder the exam is as described above.  Overall I suspect most likely symptoms related to his COPD.  The patient has not been on any maintenance medication in some time.  I think his symptoms are  most likely exacerbated by methamphetamine and alcohol use.  There is no current evidence of alcohol withdrawal.  We will obtain basic labs as well as LFTs and lipase to evaluate for pancreatitis or other hepatobiliary etiology of the pain he experienced.  There is no indication for repeat chest x-ray.  Anticipate discharge home with a prescription for Spiriva if his lab work-up is negative.  ----------------------------------------- 6:07 PM on 01/23/2019 -----------------------------------------  Lab work-up is unremarkable.  The patient is stable for discharge home.  I prescribed Spiriva as well as omeprazole.  Return precautions given, he expresses understanding. ____________________________________________   FINAL CLINICAL IMPRESSION(S) / ED DIAGNOSES  Final diagnoses:  Shortness of breath  Chronic obstructive pulmonary disease, unspecified COPD type (HCC)      NEW MEDICATIONS STARTED DURING THIS  VISIT:  New Prescriptions   OMEPRAZOLE (PRILOSEC) 40 MG CAPSULE    Take 1 capsule (40 mg total) by mouth daily.   TIOTROPIUM (SPIRIVA HANDIHALER) 18 MCG INHALATION CAPSULE    Place 1 capsule (18 mcg total) into inhaler and inhale daily.     Note:  This document was prepared using Dragon voice recognition software and may include unintentional dictation errors.    Dionne Bucy, MD 01/23/19 1807

## 2019-01-23 NOTE — ED Notes (Signed)
Rechecked pt tempeture upon request of pt to "give me a piece of mind" temp at 98.9

## 2019-01-23 NOTE — ED Notes (Signed)
Pt is being discharged to home. Aox4, VSS, pt does not c/o any pain at this time. AVS was given and explained to the pt and he verbalized understanding of all information.    

## 2019-01-23 NOTE — ED Notes (Signed)
ED Provider at bedside. 

## 2019-02-23 ENCOUNTER — Other Ambulatory Visit: Payer: Self-pay

## 2019-02-23 ENCOUNTER — Emergency Department: Payer: Commercial Managed Care - PPO

## 2019-02-23 ENCOUNTER — Emergency Department
Admission: EM | Admit: 2019-02-23 | Discharge: 2019-02-23 | Disposition: A | Discharge status: Home / Self Care | Payer: Commercial Managed Care - PPO | Attending: Emergency Medicine | Admitting: Emergency Medicine

## 2019-02-23 DIAGNOSIS — J449 Chronic obstructive pulmonary disease, unspecified: Secondary | ICD-10-CM | POA: Insufficient documentation

## 2019-02-23 DIAGNOSIS — Z79899 Other long term (current) drug therapy: Secondary | ICD-10-CM | POA: Diagnosis not present

## 2019-02-23 DIAGNOSIS — R079 Chest pain, unspecified: Secondary | ICD-10-CM

## 2019-02-23 DIAGNOSIS — F17210 Nicotine dependence, cigarettes, uncomplicated: Secondary | ICD-10-CM | POA: Diagnosis not present

## 2019-02-23 DIAGNOSIS — R0600 Dyspnea, unspecified: Secondary | ICD-10-CM

## 2019-02-23 LAB — BASIC METABOLIC PANEL
Anion gap: 11 (ref 5–15)
BUN: 12 mg/dL (ref 6–20)
CO2: 21 mmol/L — ABNORMAL LOW (ref 22–32)
Calcium: 9.2 mg/dL (ref 8.9–10.3)
Chloride: 104 mmol/L (ref 98–111)
Creatinine, Ser: 0.79 mg/dL (ref 0.61–1.24)
GFR calc Af Amer: >60 mL/min (ref >60)
GFR calc non Af Amer: >60 mL/min (ref >60)
Glucose, Bld: 105 mg/dL — ABNORMAL HIGH (ref 70–99)
Potassium: 3.4 mmol/L — ABNORMAL LOW (ref 3.5–5.1)
Sodium: 136 mmol/L (ref 135–145)

## 2019-02-23 LAB — CBC
HCT: 40.3 % (ref 39.0–52.0)
Hemoglobin: 13.7 g/dL (ref 13.0–17.0)
MCH: 32.5 pg (ref 26.0–34.0)
MCHC: 34 g/dL (ref 30.0–36.0)
MCV: 95.5 fL (ref 80.0–100.0)
Platelets: 201 10*3/uL (ref 150–400)
RBC: 4.22 MIL/uL (ref 4.22–5.81)
RDW: 14.3 % (ref 11.5–15.5)
WBC: 10.6 10*3/uL — ABNORMAL HIGH (ref 4.0–10.5)
nRBC: 0 % (ref 0.0–0.2)

## 2019-02-23 LAB — TROPONIN I: Troponin I: <0.03 ng/mL (ref <0.03)

## 2019-02-23 MED ORDER — HYDROXYZINE HCL 25 MG PO TABS
50.0000 mg | ORAL_TABLET | Freq: Once | ORAL | Status: AC
  Administered 2019-02-23: 50 mg via ORAL
  Filled 2019-02-23: qty 2

## 2019-02-23 NOTE — ED Provider Notes (Signed)
[facility] Emergency Department Provider Note  Time seen: 1:42 AM  I have reviewed the triage vital signs and the nursing notes.   HISTORY  Chief Complaint Chest Pain; Cough; and Shortness of Breath   HPI Mason Wagner is a 48 y.o. male with a past medical history of COPD, gastric reflux, anxiety, presents emergency department for chest discomfort and shortness of breath.  According to the patient for the past 10 hours he has been feeling somewhat short of breath with intermittent chest discomfort which she describes as more of a mild burning sensation like gastric reflux.  States a history of reflux, also states a history of anxiety.  States initially he thought this was just a panic attack, but states symptoms have been ongoing for approximately 10 hours and have not resolved.  Patient is worried that he could have contracted coronavirus.   Patient denies any fever does state a dry cough ongoing for weeks.  Past Medical History:  Diagnosis Date  . COPD (chronic obstructive pulmonary disease) (HCC)   . GERD (gastroesophageal reflux disease)     There are no active problems to display for this patient.   No past surgical history on file.  Prior to Admission medications   Medication Sig Start Date End Date Taking? Authorizing Provider  albuterol (PROVENTIL HFA;VENTOLIN HFA) 108 (90 Base) MCG/ACT inhaler Inhale 2 puffs into the lungs every 6 (six) hours as needed for wheezing or shortness of breath. 11/20/16   Darci Current, MD  omeprazole (PRILOSEC) 40 MG capsule Take 1 capsule (40 mg total) by mouth daily. 01/23/19   Dionne Bucy, MD  predniSONE (DELTASONE) 50 MG tablet Take 1 tablet (50 mg total) by mouth daily with breakfast. 01/21/19   Jene Every, MD  tiotropium (SPIRIVA HANDIHALER) 18 MCG inhalation capsule Place 1 capsule (18 mcg total) into inhaler and inhale daily. 01/23/19   Dionne Bucy, MD    Allergies  Allergen Reactions  .  Morphine And Related   . Biaxin [Clarithromycin] Rash    No family history on file.  Social History Social History   Tobacco Use  . Smoking status: Current Every Day Smoker    Packs/day: 2.00  . Smokeless tobacco: Current User  Substance Use Topics  . Alcohol use: Yes    Alcohol/week: 84.0 standard drinks    Types: 84 Cans of beer per week  . Drug use: Yes    Types: Methamphetamines    Review of Systems Constitutional: Negative for fever. ENT: Negative for recent illness/congestion Cardiovascular: Mild chest discomfort/burning Respiratory: Positive for shortness of breath x10 hours.  Dry cough times weeks. Gastrointestinal: Negative for abdominal pain, vomiting  Musculoskeletal: Negative for musculoskeletal complaints Neurological: Negative for headache All other ROS negative  ____________________________________________   PHYSICAL EXAM:  VITAL SIGNS: ED Triage Vitals [02/23/19 0111]  Enc Vitals Group     BP (!) 159/94     Pulse Rate 87     Resp 16     Temp 98.1 F (36.7 C)     Temp Source Oral     SpO2 98 %     Weight 150 lb (68 kg)     Height 6' (1.829 m)     Head Circumference      Peak Flow      Pain Score 2     Pain Loc      Pain Edu?      Excl. in GC?    Constitutional: Alert and oriented. Well  appearing and in no distress. Eyes: Normal exam ENT      Head: Normocephalic and atraumatic.      Mouth/Throat: Mucous membranes are moist. Cardiovascular: Normal rate, regular rhythm.  Respiratory: Normal respiratory effort without tachypnea nor retractions. Breath sounds are clear Gastrointestinal: Soft and nontender. No distention. Musculoskeletal: Nontender with normal range of motion in all extremities. No lower extremity tenderness or edema. Neurologic:  Normal speech and language. No gross focal neurologic deficits Skin:  Skin is warm, dry and intact.  Psychiatric: Mood and affect are normal.   ____________________________________________     EKG  EKG viewed and interpreted by myself shows a normal sinus rhythm at 79 bpm with a narrow QRS, normal axis, normal intervals, no concerning ST changes.  ____________________________________________    RADIOLOGY  Chest x-ray shows COPD without acute abnormality.  ____________________________________________   INITIAL IMPRESSION / ASSESSMENT AND PLAN / ED COURSE  Pertinent labs & imaging results that were available during my care of the patient were reviewed by me and considered in my medical decision making (see chart for details).   Patient presents to the emergency department for chest pain and shortness of breath, ongoing x10 hours.  Patient states a history of anxiety and thought this was just a panic attack but symptoms have not resolved so became worried and came to the emergency department.  Patient has most worried that he could have contracted coronavirus.  Denies any fever.  States dry cough times weeks.  Labs have resulted largely within normal limits.  Troponin negative.  Chest x-ray is clear.  Overall the patient appears extremely well.  We will discharge home with PCP follow-up.  Rudy JewJames A Stoll was evaluated in Emergency Department on 02/23/2019 for the symptoms described in the history of present illness. He was evaluated in the context of the global COVID-19 pandemic, which necessitated consideration that the patient might be at risk for infection with the SARS-CoV-2 virus that causes COVID-19. Institutional protocols and algorithms that pertain to the evaluation of patients at risk for COVID-19 are in a state of rapid change based on information released by regulatory bodies including the CDC and federal and state organizations. These policies and algorithms were followed during the patient's care in the ED.  ____________________________________________   FINAL CLINICAL IMPRESSION(S) / ED DIAGNOSES  Chest pain Shortness of breath   Minna AntisPaduchowski, Tully Mcinturff,  MD 02/23/19 0211

## 2019-02-23 NOTE — ED Triage Notes (Signed)
Pt states burning at center of chest that began a few hours ago. Pt states has been having a dry cough for "weeks". Pt states he feels short of breath, lack of appetite. Pt denies fever, sore throat, runny nose.

## 2019-02-23 NOTE — ED Notes (Signed)
Peripheral IV discontinued. Catheter intact. No signs of infiltration or redness. Gauze applied to IV site.   Discharge instructions reviewed with patient. Questions fielded by this RN. Patient verbalizes understanding of instructions. Patient discharged home in stable condition per Paduchowski. No acute distress noted at time of discharge.   

## 2019-02-23 NOTE — ED Notes (Signed)
Pt c/o of body aches, cough, nausea, diarrhea, and concern for COVID-19 infection  Pt reports hx of anxiety and stopped taking Clonopin last year.  Pt appears with rapid speech and frequently removing mask and rubbing hair.  Pt does appear more calm after talking with staff about hx and POC

## 2019-06-21 ENCOUNTER — Encounter: Payer: Self-pay | Admitting: Emergency Medicine

## 2019-06-21 ENCOUNTER — Emergency Department: Payer: Commercial Managed Care - PPO

## 2019-06-21 ENCOUNTER — Emergency Department
Admission: EM | Admit: 2019-06-21 | Discharge: 2019-06-22 | Disposition: A | Discharge status: Home / Self Care | Payer: Commercial Managed Care - PPO | Attending: Emergency Medicine | Admitting: Emergency Medicine

## 2019-06-21 ENCOUNTER — Other Ambulatory Visit: Payer: Self-pay

## 2019-06-21 DIAGNOSIS — R55 Syncope and collapse: Secondary | ICD-10-CM

## 2019-06-21 DIAGNOSIS — F17210 Nicotine dependence, cigarettes, uncomplicated: Secondary | ICD-10-CM | POA: Diagnosis not present

## 2019-06-21 DIAGNOSIS — L0291 Cutaneous abscess, unspecified: Secondary | ICD-10-CM | POA: Diagnosis not present

## 2019-06-21 DIAGNOSIS — R0602 Shortness of breath: Secondary | ICD-10-CM | POA: Insufficient documentation

## 2019-06-21 DIAGNOSIS — F1510 Other stimulant abuse, uncomplicated: Secondary | ICD-10-CM | POA: Insufficient documentation

## 2019-06-21 DIAGNOSIS — Z79899 Other long term (current) drug therapy: Secondary | ICD-10-CM | POA: Diagnosis not present

## 2019-06-21 DIAGNOSIS — J449 Chronic obstructive pulmonary disease, unspecified: Secondary | ICD-10-CM | POA: Insufficient documentation

## 2019-06-21 DIAGNOSIS — R200 Anesthesia of skin: Secondary | ICD-10-CM | POA: Diagnosis not present

## 2019-06-21 DIAGNOSIS — F17228 Nicotine dependence, chewing tobacco, with other nicotine-induced disorders: Secondary | ICD-10-CM | POA: Diagnosis not present

## 2019-06-21 DIAGNOSIS — R42 Dizziness and giddiness: Secondary | ICD-10-CM | POA: Diagnosis present

## 2019-06-21 LAB — BASIC METABOLIC PANEL
Anion gap: 11 (ref 5–15)
BUN: 10 mg/dL (ref 6–20)
CO2: 21 mmol/L — ABNORMAL LOW (ref 22–32)
Calcium: 9.4 mg/dL (ref 8.9–10.3)
Chloride: 103 mmol/L (ref 98–111)
Creatinine, Ser: 0.8 mg/dL (ref 0.61–1.24)
GFR calc Af Amer: >60 mL/min (ref >60)
GFR calc non Af Amer: >60 mL/min (ref >60)
Glucose, Bld: 102 mg/dL — ABNORMAL HIGH (ref 70–99)
Potassium: 3.7 mmol/L (ref 3.5–5.1)
Sodium: 135 mmol/L (ref 135–145)

## 2019-06-21 LAB — CBC WITH DIFFERENTIAL/PLATELET
Abs Immature Granulocytes: 0.05 10*3/uL (ref 0.00–0.07)
Basophils Absolute: 0.1 10*3/uL (ref 0.0–0.1)
Basophils Relative: 1 %
Eosinophils Absolute: 0.5 10*3/uL (ref 0.0–0.5)
Eosinophils Relative: 4 %
HCT: 40.1 % (ref 39.0–52.0)
Hemoglobin: 14.2 g/dL (ref 13.0–17.0)
Immature Granulocytes: 0 %
Lymphocytes Relative: 16 %
Lymphs Abs: 1.9 10*3/uL (ref 0.7–4.0)
MCH: 32.5 pg (ref 26.0–34.0)
MCHC: 35.4 g/dL (ref 30.0–36.0)
MCV: 91.8 fL (ref 80.0–100.0)
Monocytes Absolute: 1.1 10*3/uL — ABNORMAL HIGH (ref 0.1–1.0)
Monocytes Relative: 9 %
Neutro Abs: 8.3 10*3/uL — ABNORMAL HIGH (ref 1.7–7.7)
Neutrophils Relative %: 70 %
Platelets: 250 10*3/uL (ref 150–400)
RBC: 4.37 MIL/uL (ref 4.22–5.81)
RDW: 13.1 % (ref 11.5–15.5)
WBC: 11.9 10*3/uL — ABNORMAL HIGH (ref 4.0–10.5)
nRBC: 0 % (ref 0.0–0.2)

## 2019-06-21 LAB — LACTIC ACID, PLASMA: Lactic Acid, Venous: 2.5 mmol/L (ref 0.5–1.9)

## 2019-06-21 LAB — URINALYSIS, COMPLETE (UACMP) WITH MICROSCOPIC
Bacteria, UA: NONE SEEN
Bilirubin Urine: NEGATIVE
Glucose, UA: NEGATIVE mg/dL
Ketones, ur: NEGATIVE mg/dL
Leukocytes,Ua: NEGATIVE
Nitrite: NEGATIVE
Protein, ur: NEGATIVE mg/dL
Specific Gravity, Urine: 1.001 — ABNORMAL LOW (ref 1.005–1.030)
Squamous Epithelial / HPF: NONE SEEN (ref 0–5)
WBC, UA: NONE SEEN WBC/hpf (ref 0–5)
pH: 6 (ref 5.0–8.0)

## 2019-06-21 LAB — BRAIN NATRIURETIC PEPTIDE: B Natriuretic Peptide: 21 pg/mL (ref 0.0–100.0)

## 2019-06-21 LAB — TROPONIN I (HIGH SENSITIVITY): Troponin I (High Sensitivity): 4 ng/L (ref <18)

## 2019-06-21 MED ORDER — SULFAMETHOXAZOLE-TRIMETHOPRIM 800-160 MG PO TABS: 1.0000 | ORAL_TABLET | Freq: Two times a day (BID) | ORAL | 0 refills | Status: AC

## 2019-06-21 MED ORDER — SULFAMETHOXAZOLE-TRIMETHOPRIM 800-160 MG PO TABS
1.0000 | ORAL_TABLET | Freq: Once | ORAL | Status: AC
  Administered 2019-06-22: 1 via ORAL
  Filled 2019-06-21: qty 1

## 2019-06-21 NOTE — ED Triage Notes (Signed)
EMS pt to rm 17 from home with report of shortness of breath x 2 days, left side chest pain x 2 days and pt also has an area on his left outer knee that is swollen and infected looking. Pt reports he drinks 6 to 12 beers a day.

## 2019-06-22 NOTE — ED Provider Notes (Signed)
[facility] Emergency Department Provider Note   ____________________________________________    I have reviewed the triage vital signs and the nursing notes.   HISTORY  Chief Complaint Dizziness    HPI Mason WEIRAUCH is a 48 y.o. male with a history of COPD who presents with complaints of dizziness.  Patient reports he was at home when he felt lightheaded and felt like his whole body went numb.  He felt warmth travel up from his chest to his head and he thought he might pass out.  This passed relatively quickly and now he reports he feels well.  No chest pain or palpitations.  Denies shortness of breath to me.  No cough.  No nausea or vomiting.  No calf pain or swelling.  Has "some sort of bite "to his left leg.  No fevers  Past Medical History:  Diagnosis Date  . COPD (chronic obstructive pulmonary disease) ([place])   . GERD (gastroesophageal reflux disease)     There are no active problems to display for this patient.   History reviewed. No pertinent surgical history.  Prior to Admission medications   Medication Sig Start Date End Date Taking? Authorizing Provider  albuterol (PROVENTIL HFA;VENTOLIN HFA) 108 (90 Base) MCG/ACT inhaler Inhale 2 puffs into the lungs every 6 (six) hours as needed for wheezing or shortness of breath. 11/20/16   Gregor Hams, MD  omeprazole (PRILOSEC) 40 MG capsule Take 1 capsule (40 mg total) by mouth daily. 01/23/19   Arta Silence, MD  predniSONE (DELTASONE) 50 MG tablet Take 1 tablet (50 mg total) by mouth daily with breakfast. 01/21/19   Lavonia Drafts, MD  sulfamethoxazole-trimethoprim (BACTRIM DS) 800-160 MG tablet Take 1 tablet by mouth 2 (two) times daily. 06/21/19   Lavonia Drafts, MD  tiotropium (SPIRIVA HANDIHALER) 18 MCG inhalation capsule Place 1 capsule (18 mcg total) into inhaler and inhale daily. 01/23/19   Arta Silence, MD     Allergies Morphine and related and Biaxin [clarithromycin]   History reviewed. No pertinent family history.  Social History Social History   Tobacco Use  . Smoking status: Current Every Day Smoker    Packs/day: 2.00  . Smokeless tobacco: Current User  Substance Use Topics  . Alcohol use: Yes    Alcohol/week: 84.0 standard drinks    Types: 84 Cans of beer per week  . Drug use: Yes    Types: Methamphetamines    Review of Systems  Constitutional: No fever/chills Eyes: No visual changes.  ENT: No sore throat. Cardiovascular: Denies chest pain. Respiratory: Denies shortness of breath. Gastrointestinal: No abdominal pain.   Genitourinary: Negative for dysuria. Musculoskeletal: Negative for back pain. Skin: Positive abscess Neurological: Negative for headaches   ____________________________________________   PHYSICAL EXAM:  VITAL SIGNS: ED Triage Vitals  Enc Vitals Group     BP 06/21/19 2140 (!) 152/103     Pulse Rate 06/21/19 2140 88     Resp 06/21/19 2140 (!) 24     Temp 06/21/19 2140 98.2 F (36.8 C)     Temp Source 06/21/19 2140 Oral     SpO2 06/21/19 2140 100 %     Weight 06/21/19 2140 68 kg (150 lb)     Height 06/21/19 2140 1.803 m (5\' 11" )     Head Circumference --      Peak Flow --      Pain Score 06/21/19 2145 3     Pain Loc --      Pain Edu? --  Excl. in GC? --     Constitutional: Alert and oriented. Eyes: Conjunctivae are normal.  . Nose: No congestion/rhinnorhea. Mouth/Throat: Mucous membranes are moist.    Cardiovascular: Normal rate, regular rhythm. Grossly normal heart sounds.  Good peripheral circulation. Respiratory: Normal respiratory effort.  No retractions. Lungs CTAB. Gastrointestinal: Soft and nontender. No distention.  No CVA tenderness. Genitourinary: deferred Musculoskeletal: No lower extremity tenderness nor edema.  Warm and well perfused Neurologic:  Normal speech and language. No gross focal neurologic deficits are appreciated.  Skin:  Skin is warm, dry.  Patient with abscess to the  left proximal lateral lower leg, positive drainage, minimal surrounding erythema Psychiatric: Mood and affect are normal. Speech and behavior are normal.  ____________________________________________   LABS (all labs ordered are listed, but only abnormal results are displayed)  Labs Reviewed  BASIC METABOLIC PANEL - Abnormal; Notable for the following components:      Result Value   CO2 21 (*)    Glucose, Bld 102 (*)    All other components within normal limits  CBC WITH DIFFERENTIAL/PLATELET - Abnormal; Notable for the following components:   WBC 11.9 (*)    Neutro Abs 8.3 (*)    Monocytes Absolute 1.1 (*)    All other components within normal limits  LACTIC ACID, PLASMA - Abnormal; Notable for the following components:   Lactic Acid, Venous 2.5 (*)    All other components within normal limits  URINALYSIS, COMPLETE (UACMP) WITH MICROSCOPIC - Abnormal; Notable for the following components:   Color, Urine COLORLESS (*)    APPearance CLEAR (*)    Specific Gravity, Urine 1.001 (*)    Hgb urine dipstick SMALL (*)    All other components within normal limits  BRAIN NATRIURETIC PEPTIDE  LACTIC ACID, PLASMA  TROPONIN I (HIGH SENSITIVITY)  TROPONIN I (HIGH SENSITIVITY)   ____________________________________________  EKG  ED ECG REPORT I, Jene Everyobert Deane Melick, the attending physician, personally viewed and interpreted this ECG.  Date: 06/22/2019  Rhythm: normal sinus rhythm QRS Axis: normal Intervals: normal ST/T Wave abnormalities: normal Narrative Interpretation: no evidence of acute ischemia  ____________________________________________  RADIOLOGY  Chest x-ray unremarkable ____________________________________________   PROCEDURES  Procedure(s) performed: No  Procedures   Critical Care performed: No ____________________________________________   INITIAL IMPRESSION / ASSESSMENT AND PLAN / ED COURSE  Pertinent labs & imaging results that were available during my  care of the patient were reviewed by me and considered in my medical decision making (see chart for details).  Patient presents with near syncopal episode which was quite brief and now he feels well.  He thinks this may be related to anxiety.  EKG is reassuring.  Lab work is unremarkable.  Chest x-ray is benign.  He does have an abscess to the left proximal lateral lower leg as described, however this is draining.  We will start him on antibiotics for this and I recommended wound recheck in 2 days.  Nurse ordered lactic acid which is mildly elevated however the patient is afebrile with normal blood pressure with a minor abscess to the leg.  Not consistent with sepsis    ____________________________________________   FINAL CLINICAL IMPRESSION(S) / ED DIAGNOSES  Final diagnoses:  Near syncope  Abscess        Note:  This document was prepared using Dragon voice recognition software and may include unintentional dictation errors.   Jene EveryKinner, Kailana Benninger, MD 06/22/19 [phone]

## 2019-08-25 ENCOUNTER — Encounter: Payer: Self-pay | Admitting: Emergency Medicine

## 2019-08-25 DIAGNOSIS — F17228 Nicotine dependence, chewing tobacco, with other nicotine-induced disorders: Secondary | ICD-10-CM | POA: Diagnosis not present

## 2019-08-25 DIAGNOSIS — Z79899 Other long term (current) drug therapy: Secondary | ICD-10-CM | POA: Diagnosis not present

## 2019-08-25 DIAGNOSIS — J449 Chronic obstructive pulmonary disease, unspecified: Secondary | ICD-10-CM | POA: Insufficient documentation

## 2019-08-25 DIAGNOSIS — R200 Anesthesia of skin: Secondary | ICD-10-CM | POA: Insufficient documentation

## 2019-08-25 DIAGNOSIS — F17210 Nicotine dependence, cigarettes, uncomplicated: Secondary | ICD-10-CM | POA: Diagnosis not present

## 2019-08-25 DIAGNOSIS — F1510 Other stimulant abuse, uncomplicated: Secondary | ICD-10-CM | POA: Diagnosis not present

## 2019-08-25 DIAGNOSIS — R071 Chest pain on breathing: Secondary | ICD-10-CM | POA: Diagnosis not present

## 2019-08-25 DIAGNOSIS — R079 Chest pain, unspecified: Secondary | ICD-10-CM | POA: Insufficient documentation

## 2019-08-25 LAB — CBC
HCT: 45.5 % (ref 39.0–52.0)
Hemoglobin: 15.9 g/dL (ref 13.0–17.0)
MCH: 31.5 pg (ref 26.0–34.0)
MCHC: 34.9 g/dL (ref 30.0–36.0)
MCV: 90.1 fL (ref 80.0–100.0)
Platelets: 334 10*3/uL (ref 150–400)
RBC: 5.05 MIL/uL (ref 4.22–5.81)
RDW: 12.6 % (ref 11.5–15.5)
WBC: 8.5 10*3/uL (ref 4.0–10.5)
nRBC: 0 % (ref 0.0–0.2)

## 2019-08-25 NOTE — ED Triage Notes (Signed)
Pt arrived via EMS from home where pt was sitting in house after snorting meth, when pt started to have all over numbness and tingling. Pt admits to using "ice" over the weekend and regularly. Pt in triage stating, "I think im having a heart attack or something." Pt sts he is having labored breathing but his RR is 17 in triage.

## 2019-08-26 ENCOUNTER — Emergency Department: Payer: Commercial Managed Care - PPO

## 2019-08-26 ENCOUNTER — Emergency Department
Admission: EM | Admit: 2019-08-26 | Discharge: 2019-08-26 | Disposition: A | Discharge status: Home / Self Care | Payer: Commercial Managed Care - PPO | Attending: Emergency Medicine | Admitting: Emergency Medicine

## 2019-08-26 DIAGNOSIS — R0602 Shortness of breath: Secondary | ICD-10-CM

## 2019-08-26 DIAGNOSIS — F1510 Other stimulant abuse, uncomplicated: Secondary | ICD-10-CM

## 2019-08-26 DIAGNOSIS — R079 Chest pain, unspecified: Secondary | ICD-10-CM

## 2019-08-26 LAB — URINALYSIS, COMPLETE (UACMP) WITH MICROSCOPIC
Bacteria, UA: NONE SEEN
Bilirubin Urine: NEGATIVE
Glucose, UA: NEGATIVE mg/dL
Hgb urine dipstick: NEGATIVE
Ketones, ur: NEGATIVE mg/dL
Leukocytes,Ua: NEGATIVE
Nitrite: NEGATIVE
Protein, ur: NEGATIVE mg/dL
Specific Gravity, Urine: 1.005 (ref 1.005–1.030)
pH: 8 (ref 5.0–8.0)

## 2019-08-26 LAB — BASIC METABOLIC PANEL
Anion gap: 10 (ref 5–15)
BUN: 11 mg/dL (ref 6–20)
CO2: 26 mmol/L (ref 22–32)
Calcium: 10.3 mg/dL (ref 8.9–10.3)
Chloride: 103 mmol/L (ref 98–111)
Creatinine, Ser: 1.15 mg/dL (ref 0.61–1.24)
GFR calc Af Amer: >60 mL/min (ref >60)
GFR calc non Af Amer: >60 mL/min (ref >60)
Glucose, Bld: 84 mg/dL (ref 70–99)
Potassium: 3.9 mmol/L (ref 3.5–5.1)
Sodium: 139 mmol/L (ref 135–145)

## 2019-08-26 LAB — URINE DRUG SCREEN, QUALITATIVE (ARMC ONLY)
Amphetamines, Ur Screen: POSITIVE — AB
Barbiturates, Ur Screen: NOT DETECTED
Benzodiazepine, Ur Scrn: NOT DETECTED
Cannabinoid 50 Ng, Ur ~~LOC~~: NOT DETECTED
Cocaine Metabolite,Ur ~~LOC~~: NOT DETECTED
MDMA (Ecstasy)Ur Screen: NOT DETECTED
Methadone Scn, Ur: NOT DETECTED
Opiate, Ur Screen: NOT DETECTED
Phencyclidine (PCP) Ur S: NOT DETECTED
Tricyclic, Ur Screen: NOT DETECTED

## 2019-08-26 LAB — TROPONIN I (HIGH SENSITIVITY)
Troponin I (High Sensitivity): 2 ng/L (ref <18)
Troponin I (High Sensitivity): <2 ng/L (ref <18)

## 2019-08-26 LAB — HEPATIC FUNCTION PANEL
ALT: 16 U/L (ref 0–44)
AST: 18 U/L (ref 15–41)
Albumin: 3.9 g/dL (ref 3.5–5.0)
Alkaline Phosphatase: 56 U/L (ref 38–126)
Bilirubin, Direct: <0.1 mg/dL (ref 0.0–0.2)
Total Bilirubin: 0.5 mg/dL (ref 0.3–1.2)
Total Protein: 7 g/dL (ref 6.5–8.1)

## 2019-08-26 LAB — LIPASE, BLOOD: Lipase: 32 U/L (ref 11–51)

## 2019-08-26 LAB — ETHANOL: Alcohol, Ethyl (B): <10 mg/dL (ref <10)

## 2019-08-26 MED ORDER — LORAZEPAM 2 MG/ML IJ SOLN
1.0000 mg | Freq: Once | INTRAMUSCULAR | Status: AC
  Administered 2019-08-26: 1 mg via INTRAVENOUS
  Filled 2019-08-26: qty 1

## 2019-08-26 MED ORDER — LACTATED RINGERS IV BOLUS
1000.0000 mL | Freq: Once | INTRAVENOUS | Status: AC
  Administered 2019-08-26: 04:00:00 1000 mL via INTRAVENOUS

## 2019-08-26 MED ORDER — LIDOCAINE VISCOUS HCL 2 % MT SOLN
15.0000 mL | Freq: Once | OROMUCOSAL | Status: AC
  Administered 2019-08-26: 04:00:00 15 mL via ORAL
  Filled 2019-08-26: qty 15

## 2019-08-26 MED ORDER — ALUM & MAG HYDROXIDE-SIMETH 200-200-20 MG/5ML PO SUSP
15.0000 mL | Freq: Once | ORAL | Status: AC
  Administered 2019-08-26: 15 mL via ORAL
  Filled 2019-08-26: qty 30

## 2019-08-26 NOTE — ED Provider Notes (Signed)
[facility] Emergency Department Provider Note   ____________________________________________   First MD Initiated Contact with Patient 08/26/19 0258     (approximate)  I have reviewed the triage vital signs and the nursing notes.   HISTORY  Chief Complaint Numbness    HPI Mason Wagner is a 48 y.o. male with possible history of COPD, GERD, and anxiety presents to the ED complaining of chest pain and numbness.  Patient reports that approximately 3 to 4 hours prior to arrival he developed a tightness in the center of his chest as well as difficulty taking a deep breath.  He feels like his heart is racing at times and he describes feeling numb and tingly over much of his body.  He denies any associated fevers, cough, vomiting, diarrhea, or abdominal pain.  He does admit to using methamphetamines regularly, including 3 to 4 hours prior to his onset of symptoms.  He uses methamphetamines regularly and states that "it does not usually do this to me".        Past Medical History:  Diagnosis Date  . COPD (chronic obstructive pulmonary disease) (HCC)   . GERD (gastroesophageal reflux disease)     There are no active problems to display for this patient.   History reviewed. No pertinent surgical history.  Prior to Admission medications   Medication Sig Start Date End Date Taking? Authorizing Provider  albuterol (PROVENTIL HFA;VENTOLIN HFA) 108 (90 Base) MCG/ACT inhaler Inhale 2 puffs into the lungs every 6 (six) hours as needed for wheezing or shortness of breath. 11/20/16   Darci Current, MD  omeprazole (PRILOSEC) 40 MG capsule Take 1 capsule (40 mg total) by mouth daily. 01/23/19   Dionne Bucy, MD  predniSONE (DELTASONE) 50 MG tablet Take 1 tablet (50 mg total) by mouth daily with breakfast. 01/21/19   Jene Every, MD  sulfamethoxazole-trimethoprim (BACTRIM DS) 800-160 MG tablet Take 1 tablet by mouth 2 (two) times daily. 06/21/19   Jene Every, MD  tiotropium (SPIRIVA HANDIHALER) 18 MCG inhalation capsule Place 1 capsule (18 mcg total) into inhaler and inhale daily. 01/23/19   Dionne Bucy, MD    Allergies Morphine and related and Biaxin [clarithromycin]  History reviewed. No pertinent family history.  Social History Social History   Tobacco Use  . Smoking status: Current Every Day Smoker    Packs/day: 2.00  . Smokeless tobacco: Current User  Substance Use Topics  . Alcohol use: Yes    Alcohol/week: 84.0 standard drinks    Types: 84 Cans of beer per week  . Drug use: Yes    Types: Methamphetamines    Review of Systems  Constitutional: No fever/chills Eyes: No visual changes. ENT: No sore throat. Cardiovascular: Positive for chest pain. Respiratory: Positive for shortness of breath. Gastrointestinal: No abdominal pain.  No nausea, no vomiting.  No diarrhea.  No constipation. Genitourinary: Negative for dysuria. Musculoskeletal: Negative for back pain. Skin: Negative for rash. Neurological: Negative for headaches, focal weakness or numbness.  ____________________________________________   PHYSICAL EXAM:  VITAL SIGNS: ED Triage Vitals [08/25/19 2347]  Enc Vitals Group     BP (!) 167/98     Pulse Rate 81     Resp 17     Temp 98.1 F (36.7 C)     Temp Source Oral     SpO2 100 %     Weight      Height      Head Circumference      Peak Flow  Pain Score      Pain Loc      Pain Edu?      Excl. in [place]?     Constitutional: Alert and oriented, hyperactive. Eyes: Conjunctivae are normal. Head: Atraumatic. Nose: No congestion/rhinnorhea. Mouth/Throat: Mucous membranes are moist. Neck: Normal ROM Cardiovascular: Normal rate, regular rhythm. Grossly normal heart sounds. Respiratory: Normal respiratory effort.  No retractions. Lungs CTAB. Gastrointestinal: Soft and nontender. No distention. Genitourinary: deferred Musculoskeletal: No lower extremity tenderness nor edema. Neurologic:   Normal speech and language. No gross focal neurologic deficits are appreciated. Skin:  Skin is warm, dry and intact. No rash noted. Psychiatric: Mood and affect are normal. Speech and behavior are normal.  ____________________________________________   LABS (all labs ordered are listed, but only abnormal results are displayed)  Labs Reviewed  URINALYSIS, COMPLETE (UACMP) WITH MICROSCOPIC - Abnormal; Notable for the following components:      Result Value   Color, Urine STRAW (*)    APPearance CLEAR (*)    All other components within normal limits  URINE DRUG SCREEN, QUALITATIVE ([facility] ONLY) - Abnormal; Notable for the following components:   Amphetamines, Ur Screen POSITIVE (*)    All other components within normal limits  BASIC METABOLIC PANEL  CBC  ETHANOL  LIPASE, BLOOD  HEPATIC FUNCTION PANEL  CBG MONITORING, ED  TROPONIN I (HIGH SENSITIVITY)  TROPONIN I (HIGH SENSITIVITY)   ____________________________________________  EKG  ED ECG REPORT I, Blake Divine, the attending physician, personally viewed and interpreted this ECG.   Date: 08/26/2019  EKG Time: 23:45  Rate: 79  Rhythm: normal sinus rhythm  Axis: Normal  Intervals:none  ST&T Change: None   PROCEDURES  Procedure(s) performed (including Critical Care):  Procedures   ____________________________________________   INITIAL IMPRESSION / ASSESSMENT AND PLAN / ED COURSE       48 year old male with history of COPD, anxiety, and methamphetamine abuse presents to the ED with complaints of chest tightness, difficulty breathing, and diffuse numbness and tingling.  Initial EKG shows no acute ischemic changes, low suspicion for ACS given his atypical symptoms.  It seems likely that his methamphetamine abuse as well as anxiety are playing a role in his symptoms.  Initial troponin is negative and remainder of lab work is unremarkable.  Will check chest x-ray and treat patient with Ativan as well as GI cocktail  given he complains of some upper abdominal discomfort consistent with his history of GERD.  We will additionally screen lipase and LFTs.  LFTs and lipase are unremarkable, repeat troponin within normal limits.  Patient feeling much better following Ativan and GI cocktail.  Suspect symptoms related to his methamphetamine abuse, no evidence of ACS.  Counseled patient to avoid methamphetamines and to establish care with PCP.  Patient agrees with plan.      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   Blake Divine, MD 08/26/19 [phone]

## 2019-10-04 ENCOUNTER — Emergency Department
Admission: EM | Admit: 2019-10-04 | Discharge: 2019-10-04 | Disposition: A | Discharge status: Home / Self Care | Payer: Commercial Managed Care - PPO | Attending: Emergency Medicine | Admitting: Emergency Medicine

## 2019-10-04 ENCOUNTER — Other Ambulatory Visit: Payer: Self-pay

## 2019-10-04 ENCOUNTER — Emergency Department: Payer: Commercial Managed Care - PPO

## 2019-10-04 DIAGNOSIS — R0789 Other chest pain: Secondary | ICD-10-CM | POA: Diagnosis not present

## 2019-10-04 DIAGNOSIS — Z79899 Other long term (current) drug therapy: Secondary | ICD-10-CM | POA: Diagnosis not present

## 2019-10-04 DIAGNOSIS — R079 Chest pain, unspecified: Secondary | ICD-10-CM | POA: Diagnosis present

## 2019-10-04 DIAGNOSIS — F17220 Nicotine dependence, chewing tobacco, uncomplicated: Secondary | ICD-10-CM | POA: Diagnosis not present

## 2019-10-04 DIAGNOSIS — F17200 Nicotine dependence, unspecified, uncomplicated: Secondary | ICD-10-CM | POA: Insufficient documentation

## 2019-10-04 DIAGNOSIS — J449 Chronic obstructive pulmonary disease, unspecified: Secondary | ICD-10-CM | POA: Insufficient documentation

## 2019-10-04 DIAGNOSIS — Z20828 Contact with and (suspected) exposure to other viral communicable diseases: Secondary | ICD-10-CM | POA: Insufficient documentation

## 2019-10-04 LAB — BASIC METABOLIC PANEL
Anion gap: 12 (ref 5–15)
BUN: 15 mg/dL (ref 6–20)
CO2: 24 mmol/L (ref 22–32)
Calcium: 9 mg/dL (ref 8.9–10.3)
Chloride: 101 mmol/L (ref 98–111)
Creatinine, Ser: 0.85 mg/dL (ref 0.61–1.24)
GFR calc Af Amer: >60 mL/min (ref >60)
GFR calc non Af Amer: >60 mL/min (ref >60)
Glucose, Bld: 109 mg/dL — ABNORMAL HIGH (ref 70–99)
Potassium: 4 mmol/L (ref 3.5–5.1)
Sodium: 137 mmol/L (ref 135–145)

## 2019-10-04 LAB — CBC
HCT: 41.9 % (ref 39.0–52.0)
Hemoglobin: 14.1 g/dL (ref 13.0–17.0)
MCH: 30.9 pg (ref 26.0–34.0)
MCHC: 33.7 g/dL (ref 30.0–36.0)
MCV: 91.9 fL (ref 80.0–100.0)
Platelets: 255 10*3/uL (ref 150–400)
RBC: 4.56 MIL/uL (ref 4.22–5.81)
RDW: 12.9 % (ref 11.5–15.5)
WBC: 10.4 10*3/uL (ref 4.0–10.5)
nRBC: 0 % (ref 0.0–0.2)

## 2019-10-04 LAB — SARS CORONAVIRUS 2 (TAT 6-24 HRS): SARS Coronavirus 2: NEGATIVE

## 2019-10-04 LAB — TROPONIN I (HIGH SENSITIVITY)
Troponin I (High Sensitivity): 3 ng/L (ref <18)
Troponin I (High Sensitivity): 3 ng/L (ref <18)

## 2019-10-04 NOTE — Discharge Instructions (Signed)
Your cardiac work-up was reassuring.  You can follow-up with cardiology as needed however he should really get a primary care doctor to follow-up for cholesterol check as well as blood pressure recheck.  Return to the ER for any other concerns.  Stay quarantine at home until your Covid test comes back.     Person Under Monitoring Name: Mason Wagner  Location: [address] [place] [address]   Infection Prevention Recommendations for Individuals Confirmed to have, or Being Evaluated for, 2019 Novel Coronavirus (COVID-19) Infection Who Receive Care at Home  Individuals who are confirmed to have, or are being evaluated for, COVID-19 should follow the prevention steps below until a healthcare provider or local or state health department says they can return to normal activities.  Stay home except to get medical care You should restrict activities outside your home, except for getting medical care. Do not go to work, school, or public areas, and do not use public transportation or taxis.  Call ahead before visiting your doctor Before your medical appointment, call the healthcare provider and tell them that you have, or are being evaluated for, COVID-19 infection. This will help the healthcare providers office take steps to keep other people from getting infected. Ask your healthcare provider to call the local or state health department.  Monitor your symptoms Seek prompt medical attention if your illness is worsening (e.g., difficulty breathing). Before going to your medical appointment, call the healthcare provider and tell them that you have, or are being evaluated for, COVID-19 infection. Ask your healthcare provider to call the local or state health department.  Wear a facemask You should wear a facemask that covers your nose and mouth when you are in the same room with other people and when you visit a healthcare provider. People who live with or visit you should also  wear a facemask while they are in the same room with you.  Separate yourself from other people in your home As much as possible, you should stay in a different room from other people in your home. Also, you should use a separate bathroom, if available.  Avoid sharing household items You should not share dishes, drinking glasses, cups, eating utensils, towels, bedding, or other items with other people in your home. After using these items, you should wash them thoroughly with soap and water.  Cover your coughs and sneezes Cover your mouth and nose with a tissue when you cough or sneeze, or you can cough or sneeze into your sleeve. Throw used tissues in a lined trash can, and immediately wash your hands with soap and water for at least 20 seconds or use an alcohol-based hand rub.  Wash your [employer] your hands often and thoroughly with soap and water for at least 20 seconds. You can use an alcohol-based hand sanitizer if soap and water are not available and if your hands are not visibly dirty. Avoid touching your eyes, nose, and mouth with unwashed hands.   Prevention Steps for Caregivers and Household Members of Individuals Confirmed to have, or Being Evaluated for, COVID-19 Infection Being Cared for in the Home  If you live with, or provide care at home for, a person confirmed to have, or being evaluated for, COVID-19 infection please follow these guidelines to prevent infection:  Follow healthcare providers instructions Make sure that you understand and can help the patient follow any healthcare provider instructions for all care.  Provide for the patients basic needs You  should help the patient with basic needs in the home and provide support for getting groceries, prescriptions, and other personal needs.  Monitor the patients symptoms If they are getting sicker, call his or her medical provider and tell them that the patient has, or is being evaluated for, COVID-19  infection. This will help the healthcare providers office take steps to keep other people from getting infected. Ask the healthcare provider to call the local or state health department.  Limit the number of people who have contact with the patient If possible, have only one caregiver for the patient. Other household members should stay in another home or place of residence. If this is not possible, they should stay in another room, or be separated from the patient as much as possible. Use a separate bathroom, if available. Restrict visitors who do not have an essential need to be in the home.  Keep older adults, very young children, and other sick people away from the patient Keep older adults, very young children, and those who have compromised immune systems or chronic health conditions away from the patient. This includes people with chronic heart, lung, or kidney conditions, diabetes, and cancer.  Ensure good ventilation Make sure that shared spaces in the home have good air flow, such as from an air conditioner or an opened window, weather permitting.  Wash your hands often Wash your hands often and thoroughly with soap and water for at least 20 seconds. You can use an alcohol based hand sanitizer if soap and water are not available and if your hands are not visibly dirty. Avoid touching your eyes, nose, and mouth with unwashed hands. Use disposable paper towels to dry your hands. If not available, use dedicated cloth towels and replace them when they become wet.  Wear a facemask and gloves Wear a disposable facemask at all times in the room and gloves when you touch or have contact with the patients blood, body fluids, and/or secretions or excretions, such as sweat, saliva, sputum, nasal mucus, vomit, urine, or feces.  Ensure the mask fits over your nose and mouth tightly, and do not touch it during use. Throw out disposable facemasks and gloves after using them. Do not reuse. Wash  your hands immediately after removing your facemask and gloves. If your personal clothing becomes contaminated, carefully remove clothing and launder. Wash your hands after handling contaminated clothing. Place all used disposable facemasks, gloves, and other waste in a lined container before disposing them with other household waste. Remove gloves and wash your hands immediately after handling these items.  Do not share dishes, glasses, or other household items with the patient Avoid sharing household items. You should not share dishes, drinking glasses, cups, eating utensils, towels, bedding, or other items with a patient who is confirmed to have, or being evaluated for, COVID-19 infection. After the person uses these items, you should wash them thoroughly with soap and water.  Wash laundry thoroughly Immediately remove and wash clothes or bedding that have blood, body fluids, and/or secretions or excretions, such as sweat, saliva, sputum, nasal mucus, vomit, urine, or feces, on them. Wear gloves when handling laundry from the patient. Read and follow directions on labels of laundry or clothing items and detergent. In general, wash and dry with the warmest temperatures recommended on the label.  Clean all areas the individual has used often Clean all touchable surfaces, such as counters, tabletops, doorknobs, bathroom fixtures, toilets, phones, keyboards, tablets, and bedside tables, every day. Also, clean  any surfaces that may have blood, body fluids, and/or secretions or excretions on them. Wear gloves when cleaning surfaces the patient has come in contact with. Use a diluted bleach solution (e.g., dilute bleach with 1 part bleach and 10 parts water) or a household disinfectant with a label that says EPA-registered for coronaviruses. To make a bleach solution at home, add 1 tablespoon of bleach to 1 quart (4 cups) of water. For a larger supply, add  cup of bleach to 1 gallon (16 cups) of  water. Read labels of cleaning products and follow recommendations provided on product labels. Labels contain instructions for safe and effective use of the cleaning product including precautions you should take when applying the product, such as wearing gloves or eye protection and making sure you have good ventilation during use of the product. Remove gloves and wash hands immediately after cleaning.  Monitor yourself for signs and symptoms of illness Caregivers and household members are considered close contacts, should monitor their health, and will be asked to limit movement outside of the home to the extent possible. Follow the monitoring steps for close contacts listed on the symptom monitoring form.   ? If you have additional questions, contact your local health department or call the epidemiologist on call at [phone] (available 24/7). ? This guidance is subject to change. For the most up-to-date guidance from [facility], please refer to their website: [email or web address]

## 2019-10-04 NOTE — ED Triage Notes (Signed)
Patient reports noticed chest tightness that started around 4 am.

## 2019-10-04 NOTE — ED Provider Notes (Signed)
[facility] Emergency Department Provider Note  ____________________________________________   First MD Initiated Contact with Patient 10/04/19 [phone]     (approximate)  I have reviewed the triage vital signs and the nursing notes.   HISTORY  Chief Complaint Chest Pain    HPI Mason Wagner is a 48 y.o. male with COPD, GERD, anxiety who comes in with chest pain.  Patient was last seen for chest pain on 10/27.  Patient did admit to using methamphetamines at that time.  Patient was feeling better after Ativan and GI cocktail.  Patient was discharged home.  Patient now presents today for chest tightness that started at 4 AM.  Patient stated that he developed chest pain in the middle of his chest that was moderate, constant, moved around his chest into his shoulder, lasted for a few minutes, gradually got better on its own, associated with a little bit of shortness of breath that then resolved.  Patient is currently asymptomatic.  Patient states that his work told him to come here for coronavirus testing.  He endorses having prior episode of this before but never this severe.  Denies any cough or coronavirus contacts.  He is not seeing a primary care doctor for a long time.  He denies using methamphetamines recently.  Last used 4 days ago.  Also does drink alcohol.  He denies any risk factors for PE.          Past Medical History:  Diagnosis Date  . COPD (chronic obstructive pulmonary disease) (HCC)   . GERD (gastroesophageal reflux disease)     There are no active problems to display for this patient.  Surgical: None  Prior to Admission medications   Medication Sig Start Date End Date Taking? Authorizing Provider  albuterol (PROVENTIL HFA;VENTOLIN HFA) 108 (90 Base) MCG/ACT inhaler Inhale 2 puffs into the lungs every 6 (six) hours as needed for wheezing or shortness of breath. 11/20/16   Darci Current, MD  omeprazole (PRILOSEC) 40 MG capsule Take 1  capsule (40 mg total) by mouth daily. 01/23/19   Dionne Bucy, MD  predniSONE (DELTASONE) 50 MG tablet Take 1 tablet (50 mg total) by mouth daily with breakfast. 01/21/19   Jene Every, MD  sulfamethoxazole-trimethoprim (BACTRIM DS) 800-160 MG tablet Take 1 tablet by mouth 2 (two) times daily. 06/21/19   Jene Every, MD  tiotropium (SPIRIVA HANDIHALER) 18 MCG inhalation capsule Place 1 capsule (18 mcg total) into inhaler and inhale daily. 01/23/19   Dionne Bucy, MD    Allergies Morphine and related and Biaxin [clarithromycin]  No family history on file.  Social History Social History   Tobacco Use  . Smoking status: Current Every Day Smoker    Packs/day: 2.00  . Smokeless tobacco: Current User  Substance Use Topics  . Alcohol use: Yes    Alcohol/week: 84.0 standard drinks    Types: 84 Cans of beer per week  . Drug use: Yes    Types: Methamphetamines      Review of Systems Constitutional: No fever/chills Eyes: No visual changes. ENT: No sore throat. Cardiovascular: Positive chest pain Respiratory: Positive shortness of breath Gastrointestinal: No abdominal pain.  No nausea, no vomiting.  No diarrhea.  No constipation. Genitourinary: Negative for dysuria. Musculoskeletal: Negative for back pain. Skin: Negative for rash. Neurological: Negative for headaches, focal weakness or numbness. All other ROS negative ____________________________________________   PHYSICAL EXAM:  VITAL SIGNS: ED Triage Vitals  Enc Vitals Group     BP 10/04/19  [id number] (!) 145/98     Pulse Rate 10/04/19 0702 96     Resp 10/04/19 0702 17     Temp 10/04/19 0702 98.8 F (37.1 C)     Temp Source 10/04/19 0702 Oral     SpO2 10/04/19 0702 98 %     Weight 10/04/19 0658 150 lb (68 kg)     Height 10/04/19 0658 5\' 11"  (1.803 m)     Head Circumference --      Peak Flow --      Pain Score 10/04/19 0658 0     Pain Loc --      Pain Edu? --      Excl. in GC? --     Constitutional:  Alert and oriented. Well appearing and in no acute distress. Eyes: Conjunctivae are normal. EOMI. Head: Atraumatic. Nose: No congestion/rhinnorhea. Mouth/Throat: Mucous membranes are moist.   Neck: No stridor. Trachea Midline. FROM Cardiovascular: Normal rate, regular rhythm. Grossly normal heart sounds.  Good peripheral circulation.  No rash, no chest wall tenderness. Respiratory: Normal respiratory effort.  No retractions. Lungs CTAB. Gastrointestinal: Soft and nontender. No distention. No abdominal bruits.  Musculoskeletal: No lower extremity tenderness nor edema.  No joint effusions. Neurologic:  Normal speech and language. No gross focal neurologic deficits are appreciated.  Skin:  Skin is warm, dry and intact. No rash noted. Psychiatric: Mood and affect are normal. Speech and behavior are normal. GU: Deferred   ____________________________________________   LABS (all labs ordered are listed, but only abnormal results are displayed)  Labs Reviewed  BASIC METABOLIC PANEL - Abnormal; Notable for the following components:      Result Value   Glucose, Bld 109 (*)    All other components within normal limits  CBC  TROPONIN I (HIGH SENSITIVITY)  TROPONIN I (HIGH SENSITIVITY)   ____________________________________________   ED ECG REPORT I, Concha SeMary E Jolin Benavides, the attending physician, personally viewed and interpreted this ECG.  EKG is sinus rate of 96, no ST elevations, T wave inversion in aVL, normal intervals.  This looks similar to prior EKGs. ____________________________________________  RADIOLOGY Vela ProseI, Annalena Piatt E Ehan Freas, personally viewed and evaluated these images (plain radiographs) as part of my medical decision making, as well as reviewing the written report by the radiologist.  ED MD interpretation: No evidence of pneumonia  Official radiology report(s): Dg Chest 2 View  Result Date: 10/04/2019 CLINICAL DATA:  Chest pressure EXAM: CHEST - 2 VIEW COMPARISON:  None. FINDINGS:  Normal mediastinum and cardiac silhouette. Normal pulmonary vasculature. No evidence of effusion, infiltrate, or pneumothorax. Calcified granuloma in the lingula. No acute bony abnormality. IMPRESSION: No acute cardiopulmonary process. Electronically Signed   By: Genevive BiStewart  Edmunds M.D.   On: 10/04/2019 08:43    ____________________________________________   PROCEDURES  Procedure(s) performed (including Critical Care):  Procedures   ____________________________________________   INITIAL IMPRESSION / ASSESSMENT AND PLAN / ED COURSE   Rudy JewJames A Bacigalupi was evaluated in Emergency Department on 10/04/2019 for the symptoms described in the history of present illness. He was evaluated in the context of the global COVID-19 pandemic, which necessitated consideration that the patient might be at risk for infection with the SARS-CoV-2 virus that causes COVID-19. Institutional protocols and algorithms that pertain to the evaluation of patients at risk for COVID-19 are in a state of rapid change based on information released by regulatory bodies including the CDC and federal and state organizations. These policies and algorithms were followed during the patient's care in the ED.  Most Likely DDx:  -MSK (atypical chest pain) but will get cardiac markers to evaluate for ACS given risk factors/age   DDx that was also considered d/t potential to cause harm, but was found less likely based on history and physical (as detailed above): -PNA (no fevers, cough but CXR to evaluate) -PNX (reassured with equal b/l breath sounds, CXR to evaluate) -Symptomatic anemia (will get H&H) -Pulmonary embolism as no sob at rest, not pleuritic in nature, no hypoxia, PERC negative -Aortic Dissection as no tearing pain and no radiation to the mid back, pulses equal -Pericarditis no rub on exam, EKG changes or hx to suggest dx -Tamponade (no notable SOB, tachycardic, hypotensive) -Esophageal rupture (no h/o diffuse  vomitting/no crepitus)  Repeat troponin troponin is negative.  No evidence of anemia.  Chest x-ray is negative.  Repeat cardiac marker is negative.  I discussed with patient need to follow-up with a primary care doctor for blood pressure recheck as well as testing for cholesterol who is never had this done to help reduce his risk for ACS.  His chest pain seemed little atypical however he has had multiple presentations for chest pain so I recommended that he could follow-up with cardiology as needed.  He denied using any drugs during this episode.  Patient is also requesting Covid testing so that he go back to work.  Patient understands he needs to quarantine until results come back although I have low suspicion for coronavirus at this time.  Patient is currently asymptomatic and feels comfortable with this plan.  I discussed the provisional nature of ED diagnosis, the treatment so far, the ongoing plan of care, follow up appointments and return precautions with the patient and any family or support people present. They expressed understanding and agreed with the plan, discharged home. ____________________________________________   FINAL CLINICAL IMPRESSION(S) / ED DIAGNOSES   Final diagnoses:  Other chest pain     MEDICATIONS GIVEN DURING THIS VISIT:  Medications - No data to display   ED Discharge Orders    None       Note:  This document was prepared using Dragon voice recognition software and may include unintentional dictation errors.   Vanessa Hanahan, MD 10/04/19 1032

## 2021-05-04 IMAGING — CR DG CHEST 2V
1 series · 2 of 2 positions shown · non-contrast
Comparison: None.

CLINICAL DATA: Chest pressure

EXAM:
CHEST - 2 VIEW

[Series 1: dg chest 2 view · 0.14mm/px · 2 of 2 slices shown]
[im 1/2]
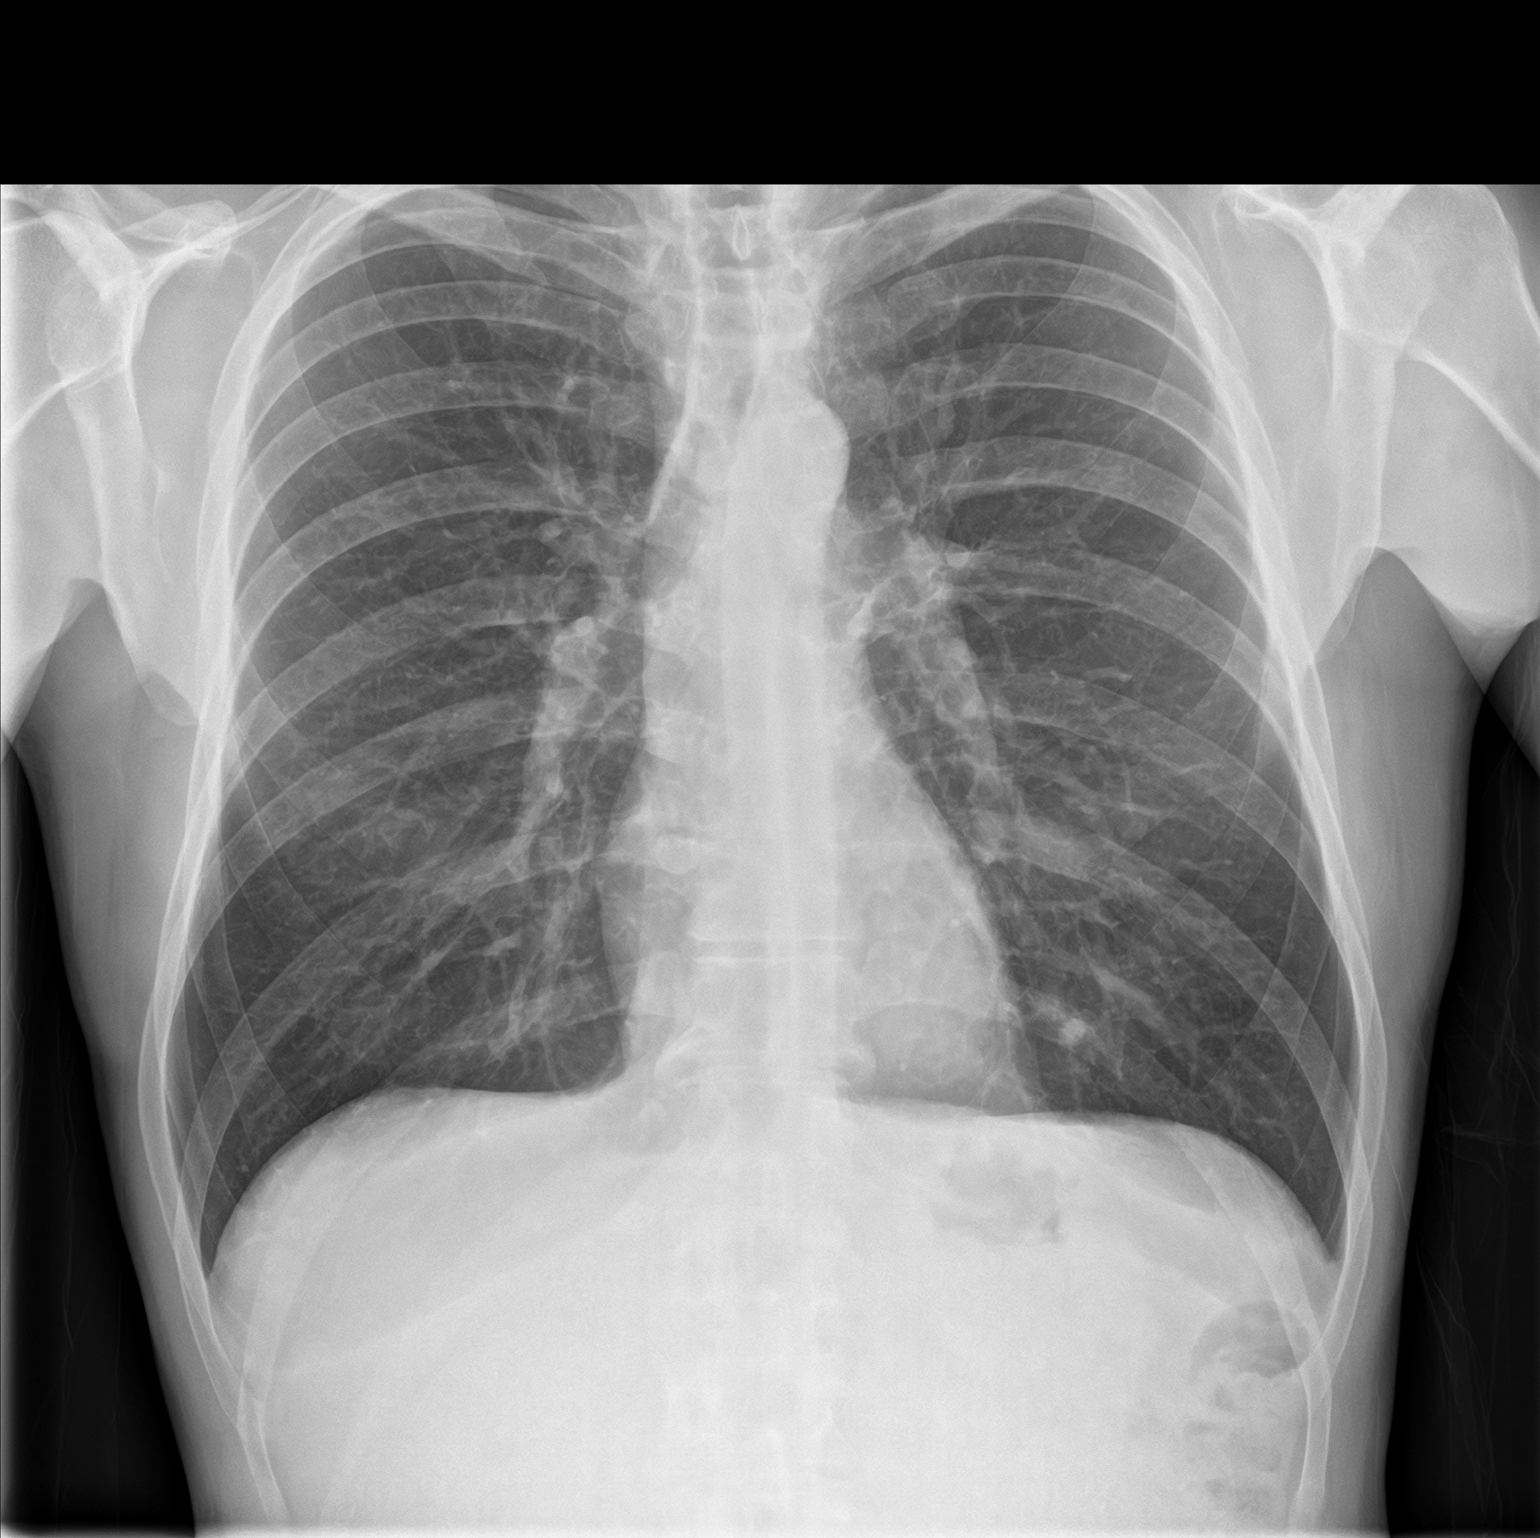
[im 2/2]
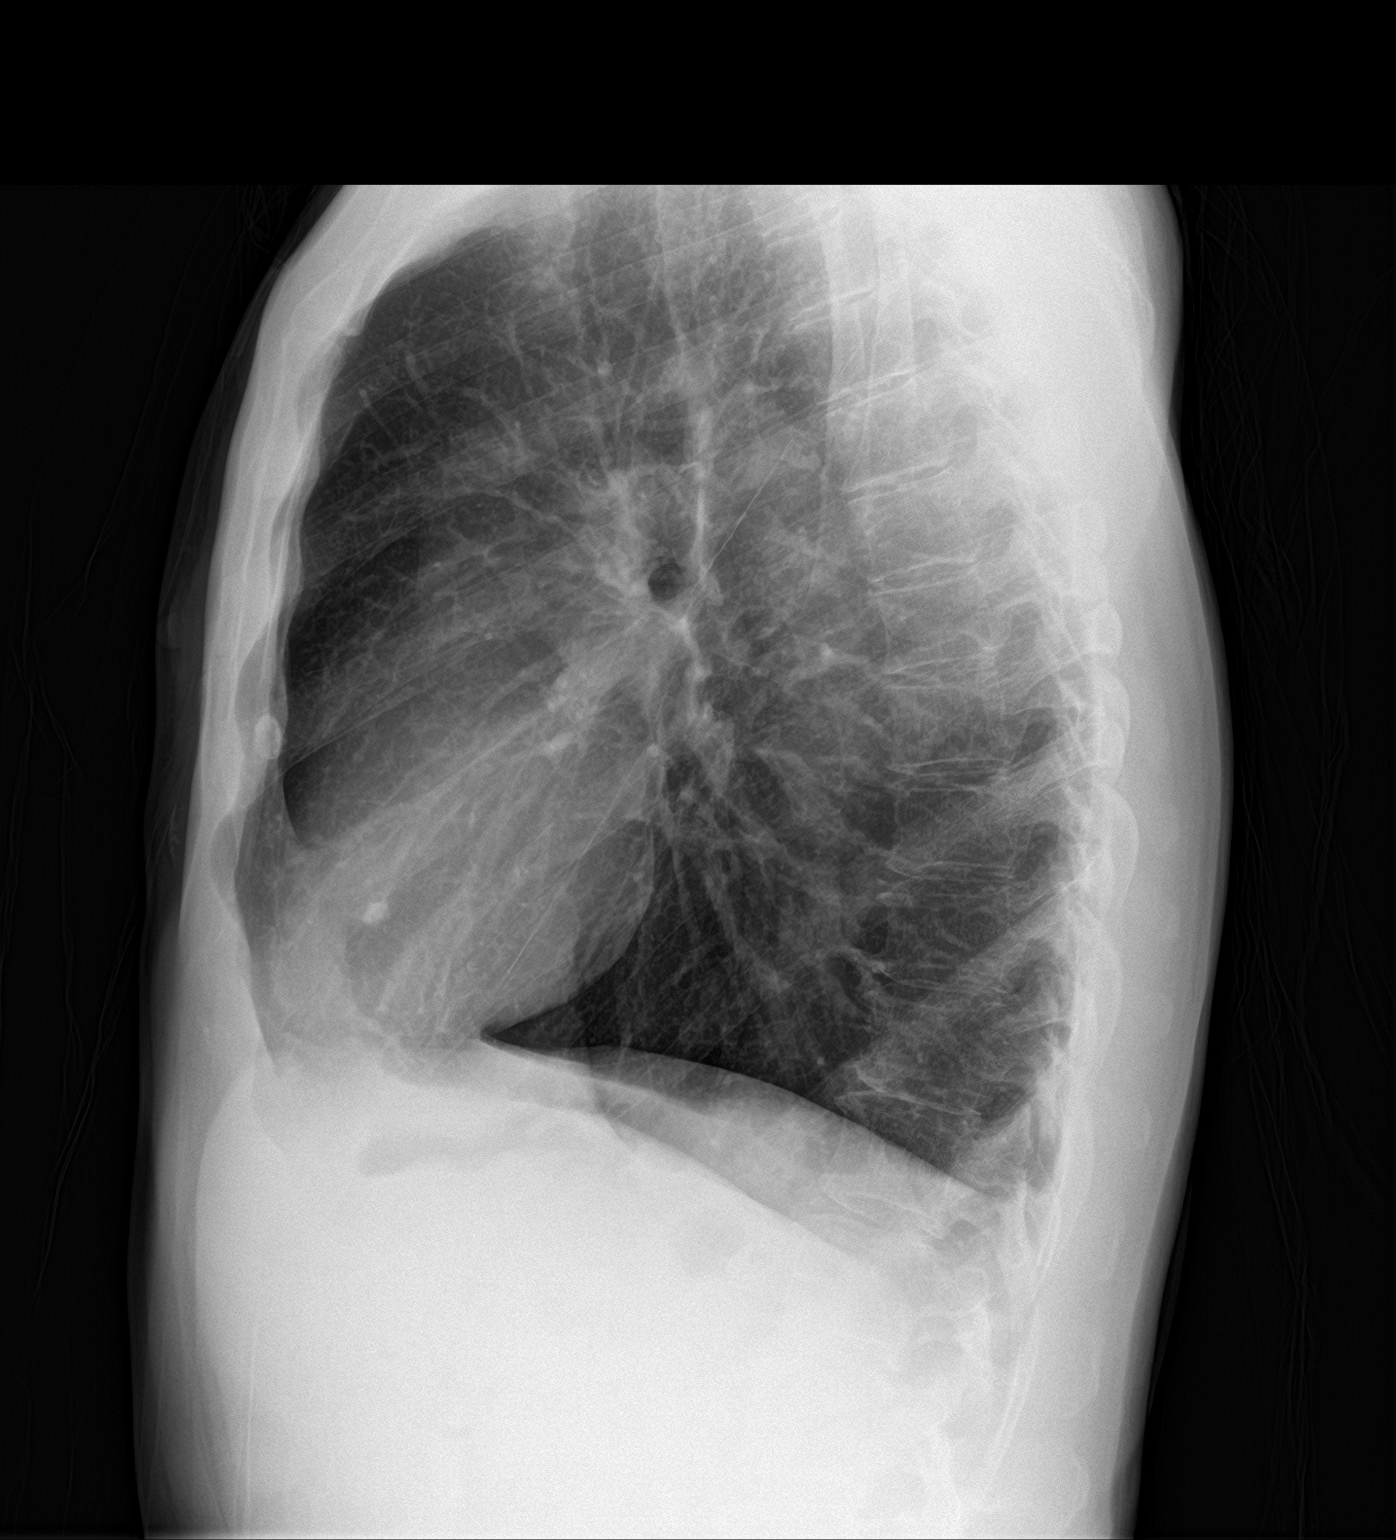

[2 of 2 positions shown; findings below may reference images not displayed]

FINDINGS: Normal mediastinum and cardiac silhouette. Normal pulmonary
vasculature. No evidence of effusion, infiltrate, or pneumothorax.
Calcified granuloma in the lingula. No acute bony abnormality.
IMPRESSION: No acute cardiopulmonary process.

## 2024-04-15 ENCOUNTER — Encounter: Payer: Self-pay | Admitting: Emergency Medicine

## 2024-04-15 ENCOUNTER — Ambulatory Visit (INDEPENDENT_AMBULATORY_CARE_PROVIDER_SITE_OTHER)

## 2024-04-15 ENCOUNTER — Ambulatory Visit: Payer: Self-pay | Admitting: Physician Assistant

## 2024-04-15 ENCOUNTER — Ambulatory Visit
Admission: EM | Admit: 2024-04-15 | Discharge: 2024-04-15 | Disposition: A | Discharge status: Home / Self Care | Attending: Physician Assistant | Admitting: Physician Assistant

## 2024-04-15 DIAGNOSIS — S6722XA Crushing injury of left hand, initial encounter: Secondary | ICD-10-CM

## 2024-04-15 NOTE — ED Provider Notes (Signed)
 [facility]    CSN: [id number] Arrival date & time: 04/15/24  1444      History   Chief Complaint Chief Complaint  Patient presents with   Finger Injury    HPI Mason Wagner is a 53 y.o. male presenting for pain, swelling and contusions of the left middle and ring fingers after accidentally smashing the fingers. Patient has most pain and swelling of the middle finger. No numbness or weakness. Mild bilateral subungual hematomas. Full ROM with pain. No previous fractures. No home treatments or OTC meds taken. Patient wanted to be checked for possible fractures.   HPI  Past Medical History:  Diagnosis Date   COPD (chronic obstructive pulmonary disease) (HCC)    GERD (gastroesophageal reflux disease)     There are no active problems to display for this patient.   History reviewed. No pertinent surgical history.     Home Medications    Prior to Admission medications   Medication Sig Start Date End Date Taking? Authorizing Provider  albuterol  (PROVENTIL  HFA;VENTOLIN  HFA) 108 (90 Base) MCG/ACT inhaler Inhale 2 puffs into the lungs every 6 (six) hours as needed for wheezing or shortness of breath. 11/20/16   Dannial Duty, MD  omeprazole  (PRILOSEC) 40 MG capsule Take 1 capsule (40 mg total) by mouth daily. 01/23/19   Siadecki, Sebastian, MD  predniSONE  (DELTASONE ) 50 MG tablet Take 1 tablet (50 mg total) by mouth daily with breakfast. 01/21/19   Bryson Carbine, MD  sulfamethoxazole -trimethoprim  (BACTRIM  DS) 800-160 MG tablet Take 1 tablet by mouth 2 (two) times daily. 06/21/19   Kinner, Robert, MD  tiotropium (SPIRIVA  HANDIHALER) 18 MCG inhalation capsule Place 1 capsule (18 mcg total) into inhaler and inhale daily. 01/23/19   Lind Repine, MD    Family History History reviewed. No pertinent family history.  Social History Social History   Tobacco Use   Smoking status: Every Day    Current packs/day: 2.00    Types: Cigarettes   Smokeless tobacco:  Current  Vaping Use   Vaping status: Never Used  Substance Use Topics   Alcohol use: Not Currently    Alcohol/week: 84.0 standard drinks of alcohol    Types: 84 Cans of beer per week   Drug use: Not Currently    Types: Methamphetamines     Allergies   Morphine and codeine and Biaxin [clarithromycin]   Review of Systems Review of Systems  Musculoskeletal:  Positive for arthralgias and joint swelling.  Skin:  Positive for color change. Negative for wound.  Neurological:  Negative for weakness and numbness.     Physical Exam Triage Vital Signs ED Triage Vitals  Encounter Vitals Group     BP 04/15/24 1459 131/79     Girls Systolic BP Percentile --      Girls Diastolic BP Percentile --      Boys Systolic BP Percentile --      Boys Diastolic BP Percentile --      Pulse Rate 04/15/24 1459 82     Resp 04/15/24 1459 16     Temp 04/15/24 1459 98.4 F (36.9 C)     Temp Source 04/15/24 1459 Oral     SpO2 04/15/24 1459 98 %     Weight --      Height --      Head Circumference --      Peak Flow --      Pain Score 04/15/24 1458 2     Pain Loc --  Pain Education --      Exclude from Growth Chart --    No data found.  Updated Vital Signs BP 131/79 (BP Location: Right Arm)   Pulse 82   Temp 98.4 F (36.9 C) (Oral)   Resp 16   SpO2 98%      Physical Exam Vitals and nursing note reviewed.  Constitutional:      General: He is not in acute distress.    Appearance: Normal appearance. He is well-developed. He is not ill-appearing.  HENT:     Head: Normocephalic and atraumatic.   Eyes:     General: No scleral icterus.    Conjunctiva/sclera: Conjunctivae normal.    Cardiovascular:     Rate and Rhythm: Normal rate.     Pulses: Normal pulses.  Pulmonary:     Effort: Pulmonary effort is normal. No respiratory distress.   Musculoskeletal:     Cervical back: Neck supple.     Comments: LEFT HAND: There is moderate swelling of the middle digit, mild swelling of  distal ring finger. Mild subungual hematomas 3rd and 4th digits. Mildly reduced ROM of DIP and PIP joints of middle finger. TTP 3rd digits distal phalanx, 4th digit distal phalanx/middle phalanx. Good pulses.   Skin:    General: Skin is warm and dry.     Capillary Refill: Capillary refill takes less than 2 seconds.   Neurological:     General: No focal deficit present.     Mental Status: He is alert. Mental status is at baseline.     Motor: No weakness.     Gait: Gait normal.   Psychiatric:        Mood and Affect: Mood normal.        Behavior: Behavior normal.      UC Treatments / Results  Labs (all labs ordered are listed, but only abnormal results are displayed) Labs Reviewed - No data to display  EKG   Radiology No results found.  Procedures Procedures (including critical care time)  Medications Ordered in UC Medications - No data to display  Initial Impression / Assessment and Plan / UC Course  I have reviewed the triage vital signs and the nursing notes.  Pertinent labs & imaging results that were available during my care of the patient were reviewed by me and considered in my medical decision making (see chart for details).   53 year old male presents for left hand 3rd and 4th digit pain, swelling and contusion after slamming hand in between [place] doors yesterday.  X-ray of left hand obtained shows possible distal tuft fractures of 3rd and 4th fingers.  Patient placed in finger splints for possible fractures.  Reviewed rest guidelines and fracture care.  X-ray overread was negative.  Medical staff tried to reach patient but unable to do so and left voicemail.  Sent message through MyChart that patient does not need to wear the finger splints for fractures.  He can wear them for comfort as needed.  Supportive care.  Follow-up as needed.  Final Clinical Impressions(s) / UC Diagnoses   Final diagnoses:  Crushing injury of left hand, initial encounter      Discharge Instructions      - Based on your initial read on the x-ray today it looks like the tips of the 3rd and 4th fingers could be broken.  It should heal okay in the next couple weeks if you are careful not to use the affected hand to lift things.  Wear splints for  the next couple weeks.  Ice and elevate hand.  Tylenol and Motrin as needed for discomfort.-Consider following up with orthopedics if still having discomfort after the next couple weeks. - I will call you with the official x-ray results today.  You have a condition requiring you to follow up with Orthopedics so please call one of the following office for appointment:   [facility] Address: [address] [place], [place] [address] Phone: [phone]  [facility] [address] [place], [place] [address] Phone: [phone]  [facility] [address] [place], [place] [address] Phone: [phone]      ED Prescriptions   None    PDMP not reviewed this encounter.   Floydene Hy, PA-C 04/15/24 1835

## 2024-04-15 NOTE — Telephone Encounter (Signed)
 Forgot to order finger splints in clinic.  2 finger splints were placed while patient was seen in clinic.

## 2024-04-15 NOTE — Discharge Instructions (Signed)
-   Based on your initial read on the x-ray today it looks like the tips of the 3rd and 4th fingers could be broken.  It should heal okay in the next couple weeks if you are careful not to use the affected hand to lift things.  Wear splints for the next couple weeks.  Ice and elevate hand.  Tylenol and Motrin as needed for discomfort.-Consider following up with orthopedics if still having discomfort after the next couple weeks. - I will call you with the official x-ray results today.  You have a condition requiring you to follow up with Orthopedics so please call one of the following office for appointment:   [facility] Address: [address] [place], [place] [address] Phone: [phone]  [facility] [address], [place], [place] [address] Phone: [phone]  [facility] [address], [place], [place] [address] Phone: [phone]

## 2024-04-15 NOTE — ED Triage Notes (Signed)
 Pt smashed his left middle finger and ring finger in the door yesterday.
